# Patient Record
Sex: Female | Born: 1985 | Race: Black or African American | Hispanic: No | Marital: Married | Smoking: Never smoker
Health system: Southern US, Community
[De-identification: ages and names within clinical notes are randomized; demographics above are authoritative.]

## PROBLEM LIST (undated history)

## (undated) DIAGNOSIS — D649 Anemia, unspecified: Secondary | ICD-10-CM

## (undated) DIAGNOSIS — D219 Benign neoplasm of connective and other soft tissue, unspecified: Secondary | ICD-10-CM

## (undated) DIAGNOSIS — I440 Atrioventricular block, first degree: Secondary | ICD-10-CM

## (undated) DIAGNOSIS — B999 Unspecified infectious disease: Secondary | ICD-10-CM

## (undated) DIAGNOSIS — K219 Gastro-esophageal reflux disease without esophagitis: Secondary | ICD-10-CM

## (undated) HISTORY — DX: Atrioventricular block, first degree: I44.0

## (undated) HISTORY — PX: MOUTH SURGERY: SHX715

---
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
## 2003-10-27 ENCOUNTER — Emergency Department (HOSPITAL_COMMUNITY): Admission: EM | Admit: 2003-10-27 | Discharge: 2003-10-27 | Payer: Self-pay | Admitting: Emergency Medicine

## 2004-05-06 ENCOUNTER — Inpatient Hospital Stay (HOSPITAL_COMMUNITY): Admission: AD | Admit: 2004-05-06 | Discharge: 2004-05-06 | Payer: Self-pay | Admitting: *Deleted

## 2004-05-29 ENCOUNTER — Emergency Department (HOSPITAL_COMMUNITY): Admission: EM | Admit: 2004-05-29 | Discharge: 2004-05-30 | Payer: Self-pay | Admitting: Emergency Medicine

## 2004-06-24 ENCOUNTER — Emergency Department (HOSPITAL_COMMUNITY): Admission: EM | Admit: 2004-06-24 | Discharge: 2004-06-24 | Payer: Self-pay | Admitting: Emergency Medicine

## 2004-08-11 ENCOUNTER — Emergency Department (HOSPITAL_COMMUNITY): Admission: EM | Admit: 2004-08-11 | Discharge: 2004-08-11 | Payer: Self-pay

## 2005-01-02 ENCOUNTER — Emergency Department (HOSPITAL_COMMUNITY): Admission: EM | Admit: 2005-01-02 | Discharge: 2005-01-02 | Payer: Self-pay | Admitting: Emergency Medicine

## 2005-07-23 ENCOUNTER — Emergency Department (HOSPITAL_COMMUNITY): Admission: EM | Admit: 2005-07-23 | Discharge: 2005-07-24 | Payer: Self-pay | Admitting: Emergency Medicine

## 2006-05-01 ENCOUNTER — Emergency Department (HOSPITAL_COMMUNITY): Admission: EM | Admit: 2006-05-01 | Discharge: 2006-05-02 | Payer: Self-pay | Admitting: Emergency Medicine

## 2007-02-17 ENCOUNTER — Encounter: Admission: RE | Admit: 2007-02-17 | Discharge: 2007-02-17 | Payer: Self-pay | Admitting: Obstetrics and Gynecology

## 2007-10-17 ENCOUNTER — Ambulatory Visit: Payer: Self-pay | Admitting: Internal Medicine

## 2007-10-17 ENCOUNTER — Ambulatory Visit: Payer: Self-pay | Admitting: *Deleted

## 2007-10-17 ENCOUNTER — Encounter (INDEPENDENT_AMBULATORY_CARE_PROVIDER_SITE_OTHER): Payer: Self-pay | Admitting: Nurse Practitioner

## 2007-10-17 LAB — CONVERTED CEMR LAB
ALT: 12 units/L (ref 0–35)
AST: 17 units/L (ref 0–37)
Albumin: 4.4 g/dL (ref 3.5–5.2)
Alkaline Phosphatase: 80 units/L (ref 39–117)
BUN: 5 mg/dL — ABNORMAL LOW (ref 6–23)
Basophils Absolute: 0 10*3/uL (ref 0.0–0.1)
Basophils Relative: 0 % (ref 0–1)
CO2: 25 meq/L (ref 19–32)
Calcium: 9.5 mg/dL (ref 8.4–10.5)
Chloride: 104 meq/L (ref 96–112)
Creatinine, Ser: 0.94 mg/dL (ref 0.40–1.20)
Eosinophils Absolute: 0 10*3/uL (ref 0.0–0.7)
Eosinophils Relative: 0 % (ref 0–5)
Glucose, Bld: 88 mg/dL (ref 70–99)
HCT: 40.9 % (ref 36.0–46.0)
Hemoglobin: 13.1 g/dL (ref 12.0–15.0)
Lymphocytes Relative: 23 % (ref 12–46)
Lymphs Abs: 2.1 10*3/uL (ref 0.7–3.3)
MCHC: 32 g/dL (ref 30.0–36.0)
MCV: 82.5 fL (ref 78.0–100.0)
Monocytes Absolute: 0.4 10*3/uL (ref 0.2–0.7)
Monocytes Relative: 5 % (ref 3–11)
Neutro Abs: 6.5 10*3/uL (ref 1.7–7.7)
Neutrophils Relative %: 72 % (ref 43–77)
Platelets: 322 10*3/uL (ref 150–400)
Potassium: 4.2 meq/L (ref 3.5–5.3)
RBC: 4.96 M/uL (ref 3.87–5.11)
RDW: 15.6 % — ABNORMAL HIGH (ref 11.5–14.0)
Sodium: 138 meq/L (ref 135–145)
TSH: 1.36 microintl units/mL (ref 0.350–5.50)
Total Bilirubin: 0.5 mg/dL (ref 0.3–1.2)
Total Protein: 7.9 g/dL (ref 6.0–8.3)
WBC: 9.1 10*3/uL (ref 4.0–10.5)

## 2007-11-01 ENCOUNTER — Emergency Department (HOSPITAL_COMMUNITY): Admission: EM | Admit: 2007-11-01 | Discharge: 2007-11-01 | Payer: Self-pay | Admitting: Emergency Medicine

## 2008-10-12 ENCOUNTER — Emergency Department (HOSPITAL_COMMUNITY): Admission: EM | Admit: 2008-10-12 | Discharge: 2008-10-12 | Payer: Self-pay | Admitting: Emergency Medicine

## 2008-12-02 ENCOUNTER — Inpatient Hospital Stay (HOSPITAL_COMMUNITY): Admission: AD | Admit: 2008-12-02 | Discharge: 2008-12-02 | Payer: Self-pay | Admitting: Obstetrics and Gynecology

## 2009-05-19 ENCOUNTER — Inpatient Hospital Stay (HOSPITAL_COMMUNITY): Admission: AD | Admit: 2009-05-19 | Discharge: 2009-05-19 | Payer: Self-pay | Admitting: Obstetrics and Gynecology

## 2009-05-19 ENCOUNTER — Ambulatory Visit: Payer: Self-pay | Admitting: Advanced Practice Midwife

## 2009-06-16 ENCOUNTER — Ambulatory Visit: Payer: Self-pay | Admitting: Obstetrics and Gynecology

## 2009-06-21 ENCOUNTER — Ambulatory Visit (HOSPITAL_COMMUNITY): Admission: RE | Admit: 2009-06-21 | Discharge: 2009-06-21 | Payer: Self-pay | Admitting: Family Medicine

## 2009-09-30 ENCOUNTER — Emergency Department (HOSPITAL_COMMUNITY): Admission: EM | Admit: 2009-09-30 | Discharge: 2009-09-30 | Payer: Self-pay | Admitting: Emergency Medicine

## 2011-04-03 LAB — GC/CHLAMYDIA PROBE AMP, GENITAL
Chlamydia, DNA Probe: NEGATIVE
GC Probe Amp, Genital: NEGATIVE

## 2011-04-03 LAB — CBC
HCT: 31.8 % — ABNORMAL LOW (ref 36.0–46.0)
Hemoglobin: 10.2 g/dL — ABNORMAL LOW (ref 12.0–15.0)
MCHC: 32.1 g/dL (ref 30.0–36.0)
MCV: 70.9 fL — ABNORMAL LOW (ref 78.0–100.0)
Platelets: 341 10*3/uL (ref 150–400)
RBC: 4.48 MIL/uL (ref 3.87–5.11)
RDW: 18.7 % — ABNORMAL HIGH (ref 11.5–15.5)
WBC: 9.3 10*3/uL (ref 4.0–10.5)

## 2011-05-08 NOTE — Group Therapy Note (Signed)
Katie Medina, METZGER NO.:  [id number]   MEDICAL RECORD NO.:  [id number]          PATIENT TYPE:  WOC   LOCATION:  [facility]                   FACILITY:  [facility]   PHYSICIAN:  Argentina Donovan, MD        DATE OF BIRTH:  05-05-1986   DATE OF SERVICE:  06/16/2009                                  CLINIC NOTE   The patient is a 25 year old African American nulligravida who went into  MAU on May 27 with heavy bleeding.  She had previously been treated with  Depo-Provera without success when seen by physicians for women.  They  did an ultrasound which apparently showed uterine fibroids.  She was  placed on Lo/Ovral at that time and it seems to have controlled the  bleeding very nicely.  When she was in the MAU she got an H and H with a  hemoglobin of 10.2 and a hematocrit of 31.8.  She bought some over-the-  counter iron which she started to take.  She had a Pap smear just 3  months ago and on physical examination the uterus is unable to be  palpated because of the habitus of the patient.  She weighs 202 and she  is 5 feet 3 inches tall.  Blood pressure however is normal at 117/76,  pulse of 87.  Talked to the patient, we are going to get an ultrasound  to evaluate the size of the fibroids and then have her come back in 3  months to make sure they are not growing and also to see how she is  doing as far as handling the bleeding.  I also want to get a CBC at that  time to make sure that her hemoglobin is back to normal.  I have given  her a prescription for a year for the Lo/Ovral and also for some Slow-Fe  and will get her scheduled for ultrasound.   IMPRESSION:  1. Severe menometrorrhagia, seems to be controlled by oral      contraceptives at this point.  2. Secondary anemia.           ______________________________  Argentina Donovan, MD     PR/MEDQ  D:  06/16/2009  T:  06/16/2009  Job:  [id number]

## 2011-09-25 LAB — POCT URINALYSIS DIP (DEVICE)
Glucose, UA: NEGATIVE
Hgb urine dipstick: NEGATIVE
Nitrite: NEGATIVE
Operator id: 29721
Protein, ur: 30 — AB
Specific Gravity, Urine: >=1.03
Urobilinogen, UA: 0.2
pH: 5.5

## 2011-09-25 LAB — GC/CHLAMYDIA PROBE AMP, GENITAL
Chlamydia, DNA Probe: NEGATIVE
GC Probe Amp, Genital: NEGATIVE

## 2011-09-25 LAB — POCT PREGNANCY, URINE: Preg Test, Ur: NEGATIVE

## 2011-10-02 LAB — CBC
HCT: 34.9 — ABNORMAL LOW
Hemoglobin: 11.7 — ABNORMAL LOW
MCHC: 33.7
MCV: 80.6
Platelets: 292
RBC: 4.33
RDW: 15.3 — ABNORMAL HIGH
WBC: 12.7 — ABNORMAL HIGH

## 2011-10-02 LAB — BASIC METABOLIC PANEL
BUN: 6
CO2: 27
Calcium: 9.5
Chloride: 105
Creatinine, Ser: 0.95
GFR calc Af Amer: >60
GFR calc non Af Amer: >60
Glucose, Bld: 100 — ABNORMAL HIGH
Potassium: 3.9
Sodium: 138

## 2011-10-02 LAB — URINALYSIS, ROUTINE W REFLEX MICROSCOPIC
Bilirubin Urine: NEGATIVE
Glucose, UA: NEGATIVE
Hgb urine dipstick: NEGATIVE
Ketones, ur: NEGATIVE
Nitrite: NEGATIVE
Protein, ur: NEGATIVE
Specific Gravity, Urine: 1.023
Urobilinogen, UA: 0.2
pH: 5.5

## 2011-10-02 LAB — DIFFERENTIAL
Basophils Absolute: 0
Basophils Relative: 0
Eosinophils Absolute: 0
Eosinophils Relative: 0
Lymphocytes Relative: 9 — ABNORMAL LOW
Lymphs Abs: 1.1
Monocytes Absolute: 0.6
Monocytes Relative: 5
Neutro Abs: 11 — ABNORMAL HIGH
Neutrophils Relative %: 86 — ABNORMAL HIGH

## 2011-10-02 LAB — PREGNANCY, URINE: Preg Test, Ur: NEGATIVE

## 2012-07-16 ENCOUNTER — Encounter (HOSPITAL_COMMUNITY): Payer: Self-pay | Admitting: Pharmacy Technician

## 2012-07-16 ENCOUNTER — Other Ambulatory Visit: Payer: Self-pay | Admitting: Obstetrics and Gynecology

## 2012-07-16 ENCOUNTER — Encounter (HOSPITAL_COMMUNITY)
Admission: RE | Admit: 2012-07-16 | Discharge: 2012-07-16 | Disposition: A | Discharge status: Home / Self Care | Payer: Managed Care, Other (non HMO) | Source: Ambulatory Visit | Attending: Obstetrics and Gynecology | Admitting: Obstetrics and Gynecology

## 2012-07-16 ENCOUNTER — Encounter (HOSPITAL_COMMUNITY): Payer: Self-pay

## 2012-07-16 HISTORY — DX: Gastro-esophageal reflux disease without esophagitis: K21.9

## 2012-07-16 HISTORY — DX: Anemia, unspecified: D64.9

## 2012-07-16 LAB — CBC
HCT: 35.7 % — ABNORMAL LOW (ref 36.0–46.0)
Hemoglobin: 11.2 g/dL — ABNORMAL LOW (ref 12.0–15.0)
MCH: 23.5 pg — ABNORMAL LOW (ref 26.0–34.0)
MCHC: 31.4 g/dL (ref 30.0–36.0)
MCV: 75 fL — ABNORMAL LOW (ref 78.0–100.0)
Platelets: 273 10*3/uL (ref 150–400)
RBC: 4.76 MIL/uL (ref 3.87–5.11)
RDW: 16.1 % — ABNORMAL HIGH (ref 11.5–15.5)
WBC: 6.5 10*3/uL (ref 4.0–10.5)

## 2012-07-16 LAB — SURGICAL PCR SCREEN
MRSA, PCR: NEGATIVE
Staphylococcus aureus: NEGATIVE

## 2012-07-16 NOTE — Patient Instructions (Addendum)
   Your procedure is scheduled on: Thursday July 25th   Enter through the [employer] of [facility] at: 7:45am Pick up the phone at the desk and dial [phone] and inform us of your arrival.  Please call this number if you have any problems the morning of surgery: [phone]  Remember: Do not eat food after midnight: Wednesday Do not drink clear liquids after: midnight Wednesday Take these medicines the morning of surgery with a SIP OF WATER:none  Do not wear jewelry, make-up, or FINGER nail polish No metal in your hair or on your body. Do not wear lotions, powders, perfumes or deodorant. Do not shave 48 hours prior to surgery. Do not bring valuables to the hospital. Contacts, dentures or bridgework may not be worn into surgery.  Leave suitcase in the car. After Surgery it may be brought to your room. For patients being admitted to the hospital, checkout time is 11:00am the day of discharge.      Remember to use your hibiclens as instructed.Please shower with 1/2 bottle the evening before your surgery and the other 1/2 bottle the morning of surgery. Neck down avoiding private area.

## 2012-07-16 NOTE — H&P (Signed)
Katie Medina is an 26 y.o.nulliparous female with massive fibroids up to 28 weeks size . Fibroids have been growing for over a year, with symptoms of menorrhagia, pelvic pressure and discomfort.  She has received 6 doses of Lupron 3.75mg .  Fibroids are still palpable above the umbilicus.  Methods of treatment, myomectomy and hysterectom have been discussed.  Informed consent was given for myomectomy.   Risks, possible complications of myomectomy have been discussed.    Pertinent Gynecological History: Menses:No regular menses since April 2013 due to lupron Bleeding: 07/12/12  Contraception: none DES exposure: denies Blood transfusions: none Sexually transmitted diseases: no past history Previous GYN Procedures: no  Last mammogram: none  Menstrual History: Menarche age 28        :Patient's last menstrual period was 07/12/2012.    Past Medical History  Diagnosis Date  . Anemia     otc iron supplement  . GERD (gastroesophageal reflux disease)     otc reflux reducer prn-has not used in last month-assoc with foods    Past Surgical History  Procedure Date  . Mouth surgery    Family History  Grandparents  Hypertension Grandmother diabetes   Social History:  reports that she has never smoked. She does not have any smokeless tobacco history on file. She reports that she drinks alcohol. She reports that she does not use illicit drugs.  Allergies: No Known Allergies   (Not in a hospital admission)  ROS  Non contributory  Last menstrual period 07/12/2012.  PHYS EXAM   HEENT wnl Chest Clear to Auscultation and percussion S1 and S2 clear BS present Large mass palpated extending from midline to the right upper quadrant. Extremeties normal Pelvic large pelvic mass extending to abdomen   \   Results for orders placed during the hospital encounter of 07/16/12 (from the past 24 hour(s))  CBC     Status: Abnormal   Collection Time   07/16/12  9:02 AM      Component  Value Range   WBC 6.5  4.0 - 10.5 K/uL   RBC 4.76  3.87 - 5.11 MIL/uL   Hemoglobin 11.2 (*) 12.0 - 15.0 g/dL   HCT 29.5 (*) 62.1 - 30.8 %   MCV 75.0 (*) 78.0 - 100.0 fL   MCH 23.5 (*) 26.0 - 34.0 pg   MCHC 31.4  30.0 - 36.0 g/dL   RDW 65.7 (*) 84.6 - 96.2 %   Platelets 273  150 - 400 K/uL  SURGICAL PCR SCREEN     Status: Normal   Collection Time   07/16/12  9:02 AM      Component Value Range   MRSA, PCR NEGATIVE  NEGATIVE   Staphylococcus aureus NEGATIVE  NEGATIVE    No results found.  Assessment/Plan: 26 yo G0 female with large pelvic mass, probable fibroids P:  Myomectomy  GREENE,ELEANOR E 07/16/2012, 10:42 PM

## 2012-07-17 ENCOUNTER — Encounter (HOSPITAL_COMMUNITY): Payer: Self-pay | Admitting: Obstetrics and Gynecology

## 2012-07-17 ENCOUNTER — Inpatient Hospital Stay (HOSPITAL_COMMUNITY)
Admission: AD | Admit: 2012-07-17 | Discharge: 2012-07-19 | DRG: 750 | Disposition: A | Discharge status: Home / Self Care | Payer: Managed Care, Other (non HMO) | Source: Ambulatory Visit | Attending: Obstetrics and Gynecology | Admitting: Obstetrics and Gynecology

## 2012-07-17 ENCOUNTER — Inpatient Hospital Stay (HOSPITAL_COMMUNITY): Payer: Managed Care, Other (non HMO) | Admitting: Anesthesiology

## 2012-07-17 ENCOUNTER — Encounter (HOSPITAL_COMMUNITY): Payer: Self-pay | Admitting: Anesthesiology

## 2012-07-17 ENCOUNTER — Encounter (HOSPITAL_COMMUNITY)
Admission: AD | Disposition: A | Discharge status: Home / Self Care | Payer: Self-pay | Source: Ambulatory Visit | Attending: Obstetrics and Gynecology

## 2012-07-17 DIAGNOSIS — Z9889 Other specified postprocedural states: Secondary | ICD-10-CM

## 2012-07-17 DIAGNOSIS — D259 Leiomyoma of uterus, unspecified: Principal | ICD-10-CM | POA: Diagnosis present

## 2012-07-17 DIAGNOSIS — D219 Benign neoplasm of connective and other soft tissue, unspecified: Secondary | ICD-10-CM

## 2012-07-17 HISTORY — PX: LAPAROTOMY: SHX154

## 2012-07-17 LAB — TYPE AND SCREEN
ABO/RH(D): B POS
Antibody Screen: NEGATIVE
Unit division: 0
Unit division: 0
Unit division: 0
Unit division: 0

## 2012-07-17 LAB — SAMPLE TO BLOOD BANK

## 2012-07-17 LAB — ABO/RH: ABO/RH(D): B POS

## 2012-07-17 LAB — PREPARE RBC (CROSSMATCH)

## 2012-07-17 SURGERY — LAPAROTOMY, EXPLORATORY
Anesthesia: General | Site: Abdomen | Wound class: Clean Contaminated

## 2012-07-17 MED ORDER — PROPOFOL 10 MG/ML IV EMUL
INTRAVENOUS | Status: AC
  Filled 2012-07-17: qty 20

## 2012-07-17 MED ORDER — GLYCOPYRROLATE 0.2 MG/ML IJ SOLN
INTRAMUSCULAR | Status: AC
  Filled 2012-07-17: qty 2

## 2012-07-17 MED ORDER — 0.9 % SODIUM CHLORIDE (POUR BTL) OPTIME
TOPICAL | Status: DC | PRN
  Administered 2012-07-17: 1000 mL

## 2012-07-17 MED ORDER — GLYCOPYRROLATE 0.2 MG/ML IJ SOLN
INTRAMUSCULAR | Status: DC | PRN
  Administered 2012-07-17: .8 mg via INTRAVENOUS

## 2012-07-17 MED ORDER — LACTATED RINGERS IV SOLN
INTRAVENOUS | Status: DC
  Administered 2012-07-17 (×3): via INTRAVENOUS

## 2012-07-17 MED ORDER — MIDAZOLAM HCL 2 MG/2ML IJ SOLN
INTRAMUSCULAR | Status: AC
  Filled 2012-07-17: qty 2

## 2012-07-17 MED ORDER — NEOSTIGMINE METHYLSULFATE 1 MG/ML IJ SOLN
INTRAMUSCULAR | Status: DC | PRN
  Administered 2012-07-17: 4 mg via INTRAVENOUS

## 2012-07-17 MED ORDER — MORPHINE SULFATE 4 MG/ML IJ SOLN
1.0000 mg | INTRAMUSCULAR | Status: DC | PRN
  Administered 2012-07-17: 2 mg via INTRAVENOUS
  Filled 2012-07-17: qty 1

## 2012-07-17 MED ORDER — HYDROMORPHONE HCL PF 1 MG/ML IJ SOLN
INTRAMUSCULAR | Status: DC | PRN
  Administered 2012-07-17: 1 mg via INTRAVENOUS

## 2012-07-17 MED ORDER — ONDANSETRON HCL 4 MG/2ML IJ SOLN
INTRAMUSCULAR | Status: DC | PRN
  Administered 2012-07-17: 4 mg via INTRAVENOUS

## 2012-07-17 MED ORDER — HYDROCODONE-ACETAMINOPHEN 5-325 MG PO TABS
1.0000 | ORAL_TABLET | ORAL | Status: DC | PRN
  Administered 2012-07-18 (×2): 2 via ORAL
  Administered 2012-07-18: 1 via ORAL
  Administered 2012-07-19: 2 via ORAL
  Filled 2012-07-17 (×4): qty 2

## 2012-07-17 MED ORDER — IBUPROFEN 600 MG PO TABS
600.0000 mg | ORAL_TABLET | Freq: Four times a day (QID) | ORAL | Status: DC | PRN
  Administered 2012-07-17: 600 mg via ORAL
  Filled 2012-07-17: qty 1

## 2012-07-17 MED ORDER — MIDAZOLAM HCL 5 MG/5ML IJ SOLN
INTRAMUSCULAR | Status: DC | PRN
  Administered 2012-07-17: 2 mg via INTRAVENOUS

## 2012-07-17 MED ORDER — FENTANYL CITRATE 0.05 MG/ML IJ SOLN
INTRAMUSCULAR | Status: DC | PRN
  Administered 2012-07-17 (×2): 150 ug via INTRAVENOUS
  Administered 2012-07-17 (×2): 100 ug via INTRAVENOUS

## 2012-07-17 MED ORDER — HYDROMORPHONE HCL PF 1 MG/ML IJ SOLN
INTRAMUSCULAR | Status: AC
  Administered 2012-07-17: 0.5 mg via INTRAVENOUS
  Filled 2012-07-17: qty 1

## 2012-07-17 MED ORDER — FENTANYL CITRATE 0.05 MG/ML IJ SOLN
INTRAMUSCULAR | Status: AC
  Filled 2012-07-17: qty 5

## 2012-07-17 MED ORDER — ROCURONIUM BROMIDE 100 MG/10ML IV SOLN
INTRAVENOUS | Status: DC | PRN
  Administered 2012-07-17: 10 mg via INTRAVENOUS
  Administered 2012-07-17: 50 mg via INTRAVENOUS
  Administered 2012-07-17: 20 mg via INTRAVENOUS

## 2012-07-17 MED ORDER — HYDROMORPHONE HCL PF 1 MG/ML IJ SOLN
0.2500 mg | INTRAMUSCULAR | Status: DC | PRN
  Administered 2012-07-17 (×2): 0.5 mg via INTRAVENOUS

## 2012-07-17 MED ORDER — DEXAMETHASONE SODIUM PHOSPHATE 10 MG/ML IJ SOLN
INTRAMUSCULAR | Status: DC | PRN
  Administered 2012-07-17: 10 mg via INTRAVENOUS

## 2012-07-17 MED ORDER — DEXTROSE IN LACTATED RINGERS 5 % IV SOLN
INTRAVENOUS | Status: DC
  Administered 2012-07-17 – 2012-07-18 (×2): via INTRAVENOUS

## 2012-07-17 MED ORDER — CEFAZOLIN SODIUM-DEXTROSE 2-3 GM-% IV SOLR
2.0000 g | INTRAVENOUS | Status: AC
  Administered 2012-07-17: 2 g via INTRAVENOUS

## 2012-07-17 MED ORDER — NEOSTIGMINE METHYLSULFATE 1 MG/ML IJ SOLN
INTRAMUSCULAR | Status: AC
  Filled 2012-07-17: qty 10

## 2012-07-17 MED ORDER — LIDOCAINE HCL (CARDIAC) 20 MG/ML IV SOLN
INTRAVENOUS | Status: DC | PRN
  Administered 2012-07-17: 50 mg via INTRAVENOUS

## 2012-07-17 MED ORDER — TEMAZEPAM 15 MG PO CAPS: 15.0000 mg | ORAL_CAPSULE | Freq: Every evening | ORAL | Status: DC | PRN

## 2012-07-17 MED ORDER — BUPIVACAINE ON-Q PAIN PUMP (FOR ORDER SET NO CHG)
INJECTION | Status: DC
  Filled 2012-07-17: qty 1

## 2012-07-17 MED ORDER — KETOROLAC TROMETHAMINE 30 MG/ML IJ SOLN
INTRAMUSCULAR | Status: AC
  Administered 2012-07-17: 30 mg via INTRAVENOUS
  Filled 2012-07-17: qty 1

## 2012-07-17 MED ORDER — CEFAZOLIN SODIUM-DEXTROSE 2-3 GM-% IV SOLR
INTRAVENOUS | Status: AC
  Filled 2012-07-17: qty 50

## 2012-07-17 MED ORDER — ROCURONIUM BROMIDE 50 MG/5ML IV SOLN
INTRAVENOUS | Status: AC
  Filled 2012-07-17: qty 1

## 2012-07-17 MED ORDER — OXYCODONE-ACETAMINOPHEN 5-325 MG PO TABS
1.0000 | ORAL_TABLET | ORAL | Status: DC | PRN
  Administered 2012-07-17: 2 via ORAL
  Filled 2012-07-17: qty 2

## 2012-07-17 MED ORDER — VASOPRESSIN 20 UNIT/ML IJ SOLN
INTRAMUSCULAR | Status: AC
  Filled 2012-07-17: qty 1

## 2012-07-17 MED ORDER — KETOROLAC TROMETHAMINE 30 MG/ML IJ SOLN
15.0000 mg | Freq: Once | INTRAMUSCULAR | Status: AC | PRN
  Administered 2012-07-17: 30 mg via INTRAVENOUS

## 2012-07-17 MED ORDER — PROPOFOL 10 MG/ML IV EMUL
INTRAVENOUS | Status: DC | PRN
  Administered 2012-07-17: 150 mg via INTRAVENOUS
  Administered 2012-07-17: 50 mg via INTRAVENOUS

## 2012-07-17 MED ORDER — HYDROMORPHONE HCL PF 1 MG/ML IJ SOLN
INTRAMUSCULAR | Status: AC
  Filled 2012-07-17: qty 1

## 2012-07-17 MED ORDER — LIDOCAINE HCL (CARDIAC) 20 MG/ML IV SOLN
INTRAVENOUS | Status: AC
  Filled 2012-07-17: qty 5

## 2012-07-17 MED ORDER — LACTATED RINGERS IV SOLN
INTRAVENOUS | Status: DC | PRN
  Administered 2012-07-17: 10:00:00 via INTRAVENOUS

## 2012-07-17 MED ORDER — DEXAMETHASONE SODIUM PHOSPHATE 10 MG/ML IJ SOLN
INTRAMUSCULAR | Status: AC
  Filled 2012-07-17: qty 1

## 2012-07-17 MED ORDER — MENTHOL 3 MG MT LOZG
1.0000 | LOZENGE | OROMUCOSAL | Status: DC | PRN
  Administered 2012-07-17 – 2012-07-18 (×2): 3 mg via ORAL
  Filled 2012-07-17: qty 9

## 2012-07-17 MED ORDER — ONDANSETRON HCL 4 MG/2ML IJ SOLN
INTRAMUSCULAR | Status: AC
  Filled 2012-07-17: qty 2

## 2012-07-17 SURGICAL SUPPLY — 45 items
BARRIER ADHS 3X4 INTERCEED (GAUZE/BANDAGES/DRESSINGS) IMPLANT
CANISTER SUCTION 2500CC (MISCELLANEOUS) ×3 IMPLANT
CLOTH BEACON ORANGE TIMEOUT ST (SAFETY) ×3 IMPLANT
CONT PATH 16OZ SNAP LID 3702 (MISCELLANEOUS) ×3 IMPLANT
COVER LIGHT HANDLE  1/PK (MISCELLANEOUS) ×1
COVER LIGHT HANDLE 1/PK (MISCELLANEOUS) ×2 IMPLANT
DRAPE LAPAROTOMY T 102X78X121 (DRAPES) ×3 IMPLANT
DRESSING TELFA 8X3 (GAUZE/BANDAGES/DRESSINGS) ×3 IMPLANT
GLOVE BIO SURGEON STRL SZ 6.5 (GLOVE) ×3 IMPLANT
GLOVE BIO SURGEON STRL SZ7 (GLOVE) ×3 IMPLANT
GLOVE BIO SURGEON STRL SZ8 (GLOVE) ×3 IMPLANT
GLOVE BIOGEL PI IND STRL 7.0 (GLOVE) ×8 IMPLANT
GLOVE BIOGEL PI IND STRL 8 (GLOVE) ×2 IMPLANT
GLOVE BIOGEL PI INDICATOR 7.0 (GLOVE) ×4
GLOVE BIOGEL PI INDICATOR 8 (GLOVE) ×1
GLOVE ECLIPSE 8.0 STRL XLNG CF (GLOVE) ×3 IMPLANT
GLOVE NEODERM STER SZ 7 (GLOVE) ×3 IMPLANT
GOWN PREVENTION PLUS LG XLONG (DISPOSABLE) ×6 IMPLANT
GOWN PREVENTION PLUS XLARGE (GOWN DISPOSABLE) ×9 IMPLANT
GOWN SURGICAL LARGE (GOWNS) ×3 IMPLANT
HEMOSTAT SURGICEL 2X14 (HEMOSTASIS) IMPLANT
HEMOSTAT SURGICEL 4X8 (HEMOSTASIS) IMPLANT
NEEDLE HYPO 25X1 1.5 SAFETY (NEEDLE) ×3 IMPLANT
NS IRRIG 1000ML POUR BTL (IV SOLUTION) ×3 IMPLANT
PACK ABDOMINAL GYN (CUSTOM PROCEDURE TRAY) ×3 IMPLANT
PAD ABD 7.5X8 STRL (GAUZE/BANDAGES/DRESSINGS) ×6 IMPLANT
PAD OB MATERNITY 4.3X12.25 (PERSONAL CARE ITEMS) ×3 IMPLANT
SPONGE GAUZE 4X4 12PLY (GAUZE/BANDAGES/DRESSINGS) ×12 IMPLANT
SPONGE LAP 18X18 X RAY DECT (DISPOSABLE) ×3 IMPLANT
STAPLER VISISTAT 35W (STAPLE) ×6 IMPLANT
SUT CHROMIC 2 0 CT 1 (SUTURE) ×3 IMPLANT
SUT PDS AB 0 CTX 60 (SUTURE) ×3 IMPLANT
SUT PLAIN 2 0 XLH (SUTURE) ×6 IMPLANT
SUT VIC AB 0 CT1 18XCR BRD8 (SUTURE) ×6 IMPLANT
SUT VIC AB 0 CT1 36 (SUTURE) ×6 IMPLANT
SUT VIC AB 0 CT1 8-18 (SUTURE) ×3
SUT VIC AB 2-0 SH 27 (SUTURE) ×1
SUT VIC AB 2-0 SH 27XBRD (SUTURE) ×2 IMPLANT
SUT VIC AB 4-0 KS 27 (SUTURE) IMPLANT
SYR CONTROL 10ML LL (SYRINGE) ×3 IMPLANT
TAPE CLOTH SURG 4X10 WHT LF (GAUZE/BANDAGES/DRESSINGS) ×3 IMPLANT
TOWEL OR 17X24 6PK STRL BLUE (TOWEL DISPOSABLE) ×9 IMPLANT
TRAY FOLEY CATH 14FR (SET/KITS/TRAYS/PACK) ×3 IMPLANT
VASOPRESSIN  20UNITS ×3 IMPLANT
WATER STERILE IRR 1000ML POUR (IV SOLUTION) IMPLANT

## 2012-07-17 NOTE — Anesthesia Postprocedure Evaluation (Signed)
  Anesthesia Post-op Note  Patient: Katie Medina  Procedure(s) Performed: Procedure(s) (LRB): EXPLORATORY LAPAROTOMY (N/A) LYSIS OF ADHESION (N/A)  Patient Location: [facility]  Anesthesia Type: General  Level of Consciousness: awake  Airway and Oxygen Therapy: Patient Spontanous Breathing  Post-op Pain: mild  Post-op Assessment: Patient's Cardiovascular Status Stable and Respiratory Function Stable  Post-op Vital Signs: stable  Complications: No apparent anesthesia complications

## 2012-07-17 NOTE — Addendum Note (Signed)
Addendum  created 07/17/12 1951 by Renford Dills, CRNA   Modules edited:Notes Section

## 2012-07-17 NOTE — Transfer of Care (Signed)
Immediate Anesthesia Transfer of Care Note  Patient: Katie Medina  Procedure(s) Performed: Procedure(s) (LRB): EXPLORATORY LAPAROTOMY (N/A) LYSIS OF ADHESION (N/A)  Patient Location: PACU  Anesthesia Type: General  Level of Consciousness: sedated  Airway & Oxygen Therapy: Patient Spontanous Breathing and Patient connected to nasal cannula oxygen  Post-op Assessment: Report given to PACU RN and Post -op Vital signs reviewed and stable  Post vital signs: stable  Complications: No apparent anesthesia complications

## 2012-07-17 NOTE — Anesthesia Postprocedure Evaluation (Signed)
Anesthesia Post Note  Patient: Katie Medina  Procedure(s) Performed: Procedure(s) (LRB): EXPLORATORY LAPAROTOMY (N/A) LYSIS OF ADHESION (N/A)  Anesthesia type: GA  Patient location: PACU  Post pain: Pain level controlled  Post assessment: Post-op Vital signs reviewed  Last Vitals:  Filed Vitals:   07/17/12 1345  BP: 113/64  Pulse: 71  Temp:   Resp: 17    Post vital signs: Reviewed  Level of consciousness: sedated  Complications: No apparent anesthesia complications

## 2012-07-17 NOTE — Anesthesia Preprocedure Evaluation (Addendum)
Anesthesia Evaluation  Patient identified by MRN, date of birth, ID band Patient awake    Reviewed: Allergy & Precautions, H&P , NPO status , Patient's Chart, lab work & pertinent test results, reviewed documented beta blocker date and time   History of Anesthesia Complications Negative for: history of anesthetic complications  Airway Mallampati: III TM Distance: >3 FB Neck ROM: full    Dental  (+) Teeth Intact   Pulmonary neg pulmonary ROS,  breath sounds clear to auscultation  Pulmonary exam normal       Cardiovascular negative cardio ROS  Rhythm:regular Rate:Normal     Neuro/Psych negative neurological ROS  negative psych ROS   GI/Hepatic Neg liver ROS, GERD- (occas PRN meds)  ,  Endo/Other  Morbid obesity  Renal/GU negative Renal ROS  Female GU complaint     Musculoskeletal   Abdominal   Peds  Hematology negative hematology ROS (+)   Anesthesia Other Findings   Reproductive/Obstetrics negative OB ROS                          Anesthesia Physical Anesthesia Plan  ASA: III  Anesthesia Plan: General ETT   Post-op Pain Management:    Induction:   Airway Management Planned:   Additional Equipment:   Intra-op Plan:   Post-operative Plan:   Informed Consent: I have reviewed the patients History and Physical, chart, labs and discussed the procedure including the risks, benefits and alternatives for the proposed anesthesia with the patient or authorized representative who has indicated his/her understanding and acceptance.   Dental Advisory Given  Plan Discussed with: CRNA and Surgeon  Anesthesia Plan Comments:         Anesthesia Quick Evaluation

## 2012-07-17 NOTE — Op Note (Signed)
07/17/2012  11:47 AM  PATIENT:  Katie Medina  26 y.o. female with enlarging fibroids.  The patient is s/p 6 Lupron injections with minimal reduction in the size of the fibroids.  PRE-OPERATIVE DIAGNOSIS:  massive fibroids  POST-OPERATIVE DIAGNOSIS:  massive fibroids  PROCEDURE:  Procedure(s): EXPLORATORY LAPAROTOMY LYSIS OF ADHESIONS  Indication and consent:  The patient is a 26 yo nulliparous female with massive, enlarging fibroids extending above the xyphoid.  Lupron was used to shrink the fibroids with minimal reduction to 28 wks size over 6 mos.  The plan is for myomectomy and the patient understands hysterectomy may be necessary if bleeding can not be controlled.  However the patient desires to retain fertility if possible.  Risks,  Possible complications have been discussed and informed consent was given for myomectomy, possible hysterectomy.  Procedure:  The patient was placed on the table in supine position after general anesthesia was induced.  A midline incision was made extending above the umbilicus.  A massive fibroid presented when the peritoneum was opened.  We were able to manipulate the mass and lift it out of the abdomen and check for anatomy.  The uterus was deviated to the left with the right round ligament draped over it.  The small relative normal sized uterus was on the left of the large ligamentous fibroid mass over 20 cm and extending down laterally on the right.  We could not isolate vascular pedicles to control bleeding and was reluctant not wanting to get into massive hemorrhage which would ensure if an attempt to remove the mass was undertaken.  The appendix was stuck to the back of the mass.  The adhesions were clamped with a Kelly clamp and incised.  The pedicle was tied with vicryl 0 suture in an interrupted fashion.  Intra operative consult to Dr. De Blanch was made for assistance.  All Gyn Oncologists were in [place]. Therefore we consulted Dr.  Antionette Char who scrubbed in.  Overall assessment was that this case would be too complicated to attempt today without adequate blood for transfusion and assistance from a Gyn Oncologist.  Therefore the procedure was stopped with plans to refer to Dr. Loree Fee.    The peritoneum was closed with 2 0 chromic in a running fashion.  The fascia was closed in an interrupted figure of eight fashion using 0 vicryl.  The subcutaneous layer was closed with 2 0 plain suture.  The skin was approximated with staples.     Sponge and instrument counts were correct.  SURGEON:  Arlyce Harman, MD  PHYSICIAN ASSISTANT: Filbert Berthold MD  Intraoperative consultants:  Dr Antionette Char Dr. Coral Ceo   ANESTHESIA:   general  EBL:  Total I/O In: 2200 [I.V.:2200] Out: 275 [Urine:200; Blood:75]  BLOOD ADMINISTERED:none  DRAINS: Urinary Catheter (Foley)   LOCAL MEDICATIONS USED:  NONE  SPECIMEN:  No Specimen  DISPOSITION OF SPECIMEN:  N/A  COUNTS:  YES  TOURNIQUET:  * No tourniquets in log *  DICTATION: .Dragon Dictation  PLAN OF CARE: Admit to inpatient   PATIENT DISPOSITION:  PACU - hemodynamically stable.   Delay start of Pharmacological VTE agent (>24hrs) due to surgical blood loss or risk of bleeding:  {YES/NO/NOT APPLICABLE:20182

## 2012-07-18 LAB — CBC
HCT: 33.3 % — ABNORMAL LOW (ref 36.0–46.0)
Hemoglobin: 10.5 g/dL — ABNORMAL LOW (ref 12.0–15.0)
MCH: 23.9 pg — ABNORMAL LOW (ref 26.0–34.0)
MCHC: 31.5 g/dL (ref 30.0–36.0)
MCV: 75.9 fL — ABNORMAL LOW (ref 78.0–100.0)
Platelets: 281 10*3/uL (ref 150–400)
RBC: 4.39 MIL/uL (ref 3.87–5.11)
RDW: 16.4 % — ABNORMAL HIGH (ref 11.5–15.5)
WBC: 12.9 10*3/uL — ABNORMAL HIGH (ref 4.0–10.5)

## 2012-07-18 MED ORDER — FERROUS SULFATE 300 (60 FE) MG/5ML PO SYRP
300.0000 mg | ORAL_SOLUTION | Freq: Every day | ORAL | Status: DC
  Administered 2012-07-18 – 2012-07-19 (×2): 300 mg via ORAL
  Filled 2012-07-18 (×3): qty 5

## 2012-07-18 MED ORDER — BISACODYL 5 MG PO TBEC
5.0000 mg | DELAYED_RELEASE_TABLET | Freq: Every day | ORAL | Status: DC | PRN
  Administered 2012-07-18: 5 mg via ORAL
  Filled 2012-07-18: qty 1

## 2012-07-18 MED ORDER — FERROUS SULFATE 220 (44 FE) MG/5ML PO ELIX
220.0000 mg | ORAL_SOLUTION | Freq: Every day | ORAL | Status: DC
  Filled 2012-07-18: qty 5

## 2012-07-18 NOTE — Progress Notes (Signed)
UR Chart review completed.  

## 2012-07-18 NOTE — Progress Notes (Signed)
1 Day Post-Op Procedure(s) (LRB): EXPLORATORY LAPAROTOMY (N/A) LYSIS OF ADHESION (N/A)  Subjective: Patient reports tolerating PO.    Objective: I have reviewed patient's vital signs, intake and output and labs.  General: alert, cooperative and no distress Resp: clear to auscultation bilaterally and normal percussion bilaterally Cardio: regular rate and rhythm, S1, S2 normal, no murmur, click, rub or gallop GI: soft, non-tender; bowel sounds normal; no masses,  no organomegaly and incision: clean and dry Extremities: extremities normal, atraumatic, no cyanosis or edema Vaginal Bleeding: none and minimal general normal  Assessment: s/p Procedure(s): EXPLORATORY LAPAROTOMY LYSIS OF ADHESION: stable  Plan: Advance diet Encourage ambulation Advance to PO medication Discontinue IV fluids  LOS: 1 day    Katie Medina E 07/18/2012, 7:51 AM

## 2012-07-18 NOTE — Progress Notes (Signed)
Patient ID: Katie Medina, female   DOB: 22-Sep-1986, 26 y.o.   MRN: [id number] Pt seen and doing well positive flatus and tolerating po.  Temp:  [97.4 F (36.3 C)-98.4 F (36.9 C)] 97.9 F (36.6 C) (07/26 1400) Pulse Rate:  [70-99] 95  (07/26 1400) Resp:  [14-18] 18  (07/26 1400) BP: (99-146)/(61-84) 120/78 mmHg (07/26 1400) SpO2:  [96 %-100 %] 98 % (07/26 1400) Weight:  [104.327 kg (230 lb)] 104.327 kg (230 lb) (07/25 2000)  Appears well  Up in bed  Abdomen non acute extrem nontender  A: POD #1 s/p ex lap for enlarged fibroids P: d/c IVF  Probable discharge in am

## 2012-07-19 ENCOUNTER — Encounter (HOSPITAL_COMMUNITY): Payer: Self-pay | Admitting: Obstetrics and Gynecology

## 2012-07-19 DIAGNOSIS — D219 Benign neoplasm of connective and other soft tissue, unspecified: Secondary | ICD-10-CM

## 2012-07-19 MED ORDER — HYDROCODONE-ACETAMINOPHEN 5-325 MG PO TABS: 1.0000 | ORAL_TABLET | ORAL | Status: AC | PRN

## 2012-07-19 MED ORDER — FERROUS SULFATE 300 (60 FE) MG/5ML PO SYRP: 300.0000 mg | ORAL_SOLUTION | Freq: Two times a day (BID) | ORAL | Status: DC

## 2012-07-19 MED ORDER — IBUPROFEN 600 MG PO TABS: 600.0000 mg | ORAL_TABLET | Freq: Four times a day (QID) | ORAL | Status: AC | PRN

## 2012-07-19 NOTE — Discharge Summary (Addendum)
Physician Discharge Summary  Patient ID: Katie Medina MRN: [id number] DOB/AGE: 1986/05/11 26 y.o.  Admit date: 07/17/2012 Discharge date: 07/19/2012  Admission Diagnoses: Enlarged fibroid uterus  Discharge Diagnoses:  Active Problems:  Fibroids   Discharged Condition: good  Hospital Course: The patient was admitted for myomectomy.  Initially with soft fibroid uterus was with a pedunculated fundal fibroid extending superiorly.  Once inside the abdomen the fibroid was noted to be a right broad ligament fibroid the uterus itself had a small 2 cm fibroid but appeared to be remarkably normal otherwise.  The fibroid did extend approximately 30 cm.  The fibroid pushed the right ovary to the left side of the body.  The appendix was attached to the infundibulopelvic ligament on the right.  It appeared normal.  The ureter appeared to be to the right pelvic sidewall.  Initially it was thought that this be removed through division of the round ligament retroperitoneal dissection under the broad ligament.  However, the decision was made to discontinue the procedure secondary to the fact of safety, availability of resources, availability blood products, and the extreme difficulty of the case.  The GYN oncologist was called.  However they were not available.  The decision was made aware of all the above to proceed with the minimal lysis of adhesions that was performed and discontinue the exploratory laparotomy.  The patient will referred to [facility] for further disposition.  Consults: Intraoperative telephone consultation with GYN oncology at [facility].  3 separate gynecologist came into the room and Dr. Antionette Char actually scrubbed in for further evaluation and recommendation.  Significant Diagnostic Studies: None  Treatments: The patient underwent exploratory laparotomy and lysis of adhesions.  Discharge Exam: Blood pressure 119/80, pulse 82, temperature 98.3 F (36.8 C),  temperature source Oral, resp. rate 20, height 5\' 3"  (1.6 m), weight 104.327 kg (230 lb), SpO2 98.00%. Please see today's progress note, exam unremarkable and within normal limits for status post exploratory laparotomy without evidence of acute abdomen.  And the wound was healing well.  Disposition: Final discharge disposition not confirmed the patient will be discharged home and will call the office on Monday for further disposition and appointment scheduling with [facility]  Discharge Orders    Future Orders Please Complete By Expires   Diet - low sodium heart healthy      Increase activity slowly      Discharge instructions      Comments:   Routine care - call with any questions F/u one week   Driving Restrictions      Comments:   None for 2 weeks   Lifting restrictions      Comments:   Nothing you have to stop talking to do   Sexual Activity Restrictions      Comments:   None for 2 weeks   Other Restrictions      Comments:   Per protocol   Discharge wound care:      Comments:   Ok to shower  Please report any discharge, or redness or fever   No dressing needed      Call MD for:  extreme fatigue      Call MD for:  persistant dizziness or light-headedness      Call MD for:  hives      Call MD for:  difficulty breathing, headache or visual disturbances      Call MD for:  redness, tenderness, or signs of infection (pain, swelling, redness,  odor or green/yellow discharge around incision site)      Call MD for:  severe uncontrolled pain      Call MD for:  persistant nausea and vomiting      Call MD for:  temperature >100.4        Medication List  As of 07/19/2012  1:14 PM   STOP taking these medications         OVER THE COUNTER MEDICATION         TAKE these medications         ferrous sulfate 300 (60 FE) MG/5ML syrup   Take 5 mLs (300 mg total) by mouth 2 (two) times daily.      HYDROcodone-acetaminophen 5-325 MG per tablet   Commonly known as: NORCO/VICODIN    Take 1-2 tablets by mouth every 4 (four) hours as needed.      ibuprofen 600 MG tablet   Commonly known as: ADVIL,MOTRIN   Take 1 tablet (600 mg total) by mouth every 6 (six) hours as needed (mild pain).            this is a Dr. Filbert Berthold dictating for Dr. Arlyce Harman  Signed: Delbert Harness. 07/19/2012, 1:14 PM

## 2012-07-19 NOTE — Progress Notes (Signed)
Discharge instructions reviewed with patient.  Patient able to " Teach Back" home care and signs/symptoms to report to MD.  No home equipment needed.  Ambulated to car with staff without incident and discharged to care of family. 

## 2012-07-19 NOTE — Progress Notes (Signed)
2 Days Post-Op Procedure(s) (LRB): EXPLORATORY LAPAROTOMY (N/A) LYSIS OF ADHESION (N/A)  Subjective: Patient reports tolerating PO, + flatus, + BM and no problems voiding.   Doing well since her pain control is adequate with the Vicodin she has tolerated this without any problems.  She wishes to go home. Objective: I have reviewed patient's vital signs, intake and output, medications and labs.  General: alert, cooperative and no distress Resp: No respiratory difficulty Cardio: regular rate and rhythm GI: normal findings: Fibroid uterus is palpable above the umbilicus and the abdomen is nonacute.  An attempt and not distended.  Other than secondary to the mass Extremities: extremities normal, atraumatic, no cyanosis or edema    Assessment: s/p Procedure(s) (LRB): EXPLORATORY LAPAROTOMY (N/A) LYSIS OF ADHESION (N/A): stable  Plan: Discharge home  LOS: 2 days    Katie Medina H. 07/19/2012, 1:10 PM

## 2012-08-08 ENCOUNTER — Ambulatory Visit: Payer: Managed Care, Other (non HMO) | Admitting: Gynecology

## 2012-08-18 ENCOUNTER — Other Ambulatory Visit (HOSPITAL_COMMUNITY)
Admission: RE | Admit: 2012-08-18 | Discharge: 2012-08-18 | Disposition: A | Discharge status: Home / Self Care | Payer: Managed Care, Other (non HMO) | Source: Ambulatory Visit | Attending: Obstetrics and Gynecology | Admitting: Obstetrics and Gynecology

## 2012-08-18 ENCOUNTER — Other Ambulatory Visit: Payer: Self-pay | Admitting: Obstetrics and Gynecology

## 2012-08-18 DIAGNOSIS — Z124 Encounter for screening for malignant neoplasm of cervix: Secondary | ICD-10-CM | POA: Insufficient documentation

## 2012-08-24 HISTORY — PX: MYOMECTOMY: SHX85

## 2012-09-10 ENCOUNTER — Ambulatory Visit (INDEPENDENT_AMBULATORY_CARE_PROVIDER_SITE_OTHER): Payer: Managed Care, Other (non HMO) | Admitting: Surgery

## 2013-11-17 ENCOUNTER — Inpatient Hospital Stay (HOSPITAL_COMMUNITY)
Admission: AD | Admit: 2013-11-17 | Discharge: 2013-11-17 | Disposition: A | Discharge status: Home / Self Care | Payer: Managed Care, Other (non HMO) | Source: Ambulatory Visit | Attending: Family Medicine | Admitting: Family Medicine

## 2013-11-17 ENCOUNTER — Encounter (HOSPITAL_COMMUNITY): Payer: Self-pay | Admitting: *Deleted

## 2013-11-17 DIAGNOSIS — D649 Anemia, unspecified: Secondary | ICD-10-CM | POA: Insufficient documentation

## 2013-11-17 DIAGNOSIS — L293 Anogenital pruritus, unspecified: Secondary | ICD-10-CM | POA: Insufficient documentation

## 2013-11-17 DIAGNOSIS — A499 Bacterial infection, unspecified: Secondary | ICD-10-CM | POA: Insufficient documentation

## 2013-11-17 DIAGNOSIS — R109 Unspecified abdominal pain: Secondary | ICD-10-CM | POA: Insufficient documentation

## 2013-11-17 DIAGNOSIS — B9689 Other specified bacterial agents as the cause of diseases classified elsewhere: Secondary | ICD-10-CM | POA: Insufficient documentation

## 2013-11-17 DIAGNOSIS — N760 Acute vaginitis: Secondary | ICD-10-CM | POA: Insufficient documentation

## 2013-11-17 HISTORY — DX: Benign neoplasm of connective and other soft tissue, unspecified: D21.9

## 2013-11-17 HISTORY — DX: Unspecified infectious disease: B99.9

## 2013-11-17 LAB — URINALYSIS, ROUTINE W REFLEX MICROSCOPIC
Bilirubin Urine: NEGATIVE
Glucose, UA: NEGATIVE mg/dL
Hgb urine dipstick: NEGATIVE
Ketones, ur: NEGATIVE mg/dL
Leukocytes, UA: NEGATIVE
Nitrite: NEGATIVE
Protein, ur: NEGATIVE mg/dL
Specific Gravity, Urine: >1.03 — ABNORMAL HIGH (ref 1.005–1.030)
Urobilinogen, UA: 0.2 mg/dL (ref 0.0–1.0)
pH: 6 (ref 5.0–8.0)

## 2013-11-17 LAB — CBC
HCT: 30.9 % — ABNORMAL LOW (ref 36.0–46.0)
Hemoglobin: 9.3 g/dL — ABNORMAL LOW (ref 12.0–15.0)
MCH: 19.5 pg — ABNORMAL LOW (ref 26.0–34.0)
MCHC: 30.1 g/dL (ref 30.0–36.0)
MCV: 64.6 fL — ABNORMAL LOW (ref 78.0–100.0)
Platelets: 394 10*3/uL (ref 150–400)
RBC: 4.78 MIL/uL (ref 3.87–5.11)
RDW: 18.7 % — ABNORMAL HIGH (ref 11.5–15.5)
WBC: 8.9 10*3/uL (ref 4.0–10.5)

## 2013-11-17 LAB — WET PREP, GENITAL
Trich, Wet Prep: NONE SEEN
Yeast Wet Prep HPF POC: NONE SEEN

## 2013-11-17 LAB — POCT PREGNANCY, URINE: Preg Test, Ur: NEGATIVE

## 2013-11-17 MED ORDER — KETOROLAC TROMETHAMINE 60 MG/2ML IM SOLN
60.0000 mg | Freq: Once | INTRAMUSCULAR | Status: AC
  Administered 2013-11-17: 60 mg via INTRAMUSCULAR
  Filled 2013-11-17: qty 2

## 2013-11-17 MED ORDER — FLUCONAZOLE 150 MG PO TABS: 150.0000 mg | ORAL_TABLET | Freq: Every day | ORAL | Status: DC

## 2013-11-17 MED ORDER — METRONIDAZOLE 500 MG PO TABS: 500.0000 mg | ORAL_TABLET | Freq: Two times a day (BID) | ORAL | Status: DC

## 2013-11-17 NOTE — MAU Note (Signed)
This morning had bad cramps- felt like needed to use restroom, (no diarrhea)got hot, felt nauseated.  Also been having itching for about 3 wks.Using otc cream at night for vag itching- started using it when 'skin started shedding'.

## 2013-11-17 NOTE — MAU Provider Note (Signed)
History     CSN: [id number]  Arrival date and time: 11/17/13 4098   First Provider Initiated Contact with Patient 11/17/13 [id number]      Chief Complaint  Patient presents with  . Abdominal Pain   HPI  [name] is a 27 y.o. female G0P0 who presents with abdominal cramping that has been going on for 3 days. The cramping is on going, however it comes and goes. She felt a cramp this morning and felt like she could not take the pain any more. She currently rates her pain 7/10. She also complains of vaginal irritation and vaginal itching; denies abnormal discharge. Her and her spouse are trying to become pregnant. She was due for her period at the beginning of the month and it never came. She has had irregular periods for 3-4 months.   OB History   Grav Para Term Preterm Abortions TAB SAB Ect Mult Living   0               Past Medical History  Diagnosis Date  . Anemia     otc iron supplement  . GERD (gastroesophageal reflux disease)     otc reflux reducer prn-has not used in last month-assoc with foods  . Fibroid   . Infection     UTI    Past Surgical History  Procedure Laterality Date  . Mouth surgery    . Laparotomy  07/17/2012    Procedure: EXPLORATORY LAPAROTOMY;  Surgeon: Fortino Sic, MD;  Location: [facility];  Service: Gynecology;  Laterality: N/A;    Family History  Problem Relation Age of Onset  . Diabetes Father   . Asthma Brother   . Diabetes Maternal Grandmother   . Hypertension Maternal Grandfather   . Cancer Maternal Grandfather     prostate  . Vision loss Maternal Grandfather   . Diabetes Paternal Grandmother   . Hearing loss Neg Hx     History  Substance Use Topics  . Smoking status: Never Smoker   . Smokeless tobacco: Never Used  . Alcohol Use: Yes     Comment: rare    Allergies:  Allergies  Allergen Reactions  . Percocet [Oxycodone-Acetaminophen] Other (See Comments)    Causes back pain.    Prescriptions prior to admission   Medication Sig Dispense Refill  . famotidine (PEPCID) 20 MG tablet Take 40 mg by mouth daily as needed for heartburn or indigestion.      . miconazole (MICOTIN) 2 % cream Apply 1 application topically at bedtime as needed (For itching.).       Results for orders placed during the hospital encounter of 11/17/13 (from the past 24 hour(s))  URINALYSIS, ROUTINE W REFLEX MICROSCOPIC     Status: Abnormal   Collection Time    11/17/13  8:50 AM      Result Value Range   Color, Urine YELLOW  YELLOW   APPearance CLEAR  CLEAR   Specific Gravity, Urine >1.030 (*) 1.005 - 1.030   pH 6.0  5.0 - 8.0   Glucose, UA NEGATIVE  NEGATIVE mg/dL   Hgb urine dipstick NEGATIVE  NEGATIVE   Bilirubin Urine NEGATIVE  NEGATIVE   Ketones, ur NEGATIVE  NEGATIVE mg/dL   Protein, ur NEGATIVE  NEGATIVE mg/dL   Urobilinogen, UA 0.2  0.0 - 1.0 mg/dL   Nitrite NEGATIVE  NEGATIVE   Leukocytes, UA NEGATIVE  NEGATIVE  POCT PREGNANCY, URINE     Status: None   Collection Time  11/17/13  9:14 AM      Result Value Range   Preg Test, Ur NEGATIVE  NEGATIVE  CBC     Status: Abnormal   Collection Time    11/17/13  9:49 AM      Result Value Range   WBC 8.9  4.0 - 10.5 K/uL   RBC 4.78  3.87 - 5.11 MIL/uL   Hemoglobin 9.3 (*) 12.0 - 15.0 g/dL   HCT 27.2 (*) 53.6 - 64.4 %   MCV 64.6 (*) 78.0 - 100.0 fL   MCH 19.5 (*) 26.0 - 34.0 pg   MCHC 30.1  30.0 - 36.0 g/dL   RDW 03.4 (*) 74.2 - 59.5 %   Platelets 394  150 - 400 K/uL  WET PREP, GENITAL     Status: Abnormal   Collection Time    11/17/13 10:21 AM      Result Value Range   Yeast Wet Prep HPF POC NONE SEEN  NONE SEEN   Trich, Wet Prep NONE SEEN  NONE SEEN   Clue Cells Wet Prep HPF POC FEW (*) NONE SEEN   WBC, Wet Prep HPF POC FEW (*) NONE SEEN     Review of Systems  Constitutional: Negative for fever and chills.  Gastrointestinal: Positive for nausea and abdominal pain. Negative for vomiting, diarrhea and constipation.  Genitourinary: Negative for dysuria,  urgency, frequency and hematuria.       No vaginal discharge. No vaginal bleeding. No dysuria.   Musculoskeletal: Negative for back pain.  Neurological: Negative for dizziness and headaches.   Physical Exam   Blood pressure 119/69, pulse 86, temperature 98.3 F (36.8 C), temperature source Oral, resp. rate 18, height 5\' 4"  (1.626 m), weight 110.678 kg (244 lb), last menstrual period 10/03/2013.  Physical Exam  Constitutional: She is oriented to person, place, and time. She appears well-developed and well-nourished. No distress.  HENT:  Head: Normocephalic.  Eyes: Pupils are equal, round, and reactive to light.  Neck: Neck supple.  Respiratory: Effort normal.  GI: Soft. She exhibits no distension. There is no tenderness. There is no rebound and no guarding.  Genitourinary:  Speculum exam: Vagina - Small amount of creamy, thick, white discharge, no odor Cervix - No contact bleeding Bimanual exam: Cervix closed Uterus non tender, normal size Adnexa non tender, no masses bilaterally GC/Chlam, wet prep done Chaperone present for exam.   Neurological: She is alert and oriented to person, place, and time.  Skin: Skin is warm and dry. She is not diaphoretic.    MAU Course  Procedures None  MDM Toradol 60 mg IM in MAU  Wet prep GC/Chlamydia-pending  Pain at the time of discharge is 0/10  Assessment and Plan   A: Abdominal pain  Bacterial vaginosis Anemia   P: Discharge home RX: Flagyl- no alcohol Start taking Iron supplement as directed on the bottle Return to MAU as needed, if symptoms worsen Take a home pregnancy test if no period by next week.  Ok to take ibuprofen as directed on the bottle.   [name] IRENE NP 11/17/2013, 4:32 PM

## 2013-11-17 NOTE — MAU Note (Signed)
Neg HPT last wk, is trying to get preg.

## 2013-11-17 NOTE — MAU Provider Note (Signed)
Chart reviewed and agree with management and plan.  

## 2013-11-18 LAB — GC/CHLAMYDIA PROBE AMP
CT Probe RNA: NEGATIVE
GC Probe RNA: NEGATIVE

## 2014-11-28 ENCOUNTER — Encounter (HOSPITAL_COMMUNITY): Payer: Self-pay | Admitting: Emergency Medicine

## 2014-11-28 ENCOUNTER — Emergency Department (HOSPITAL_COMMUNITY)
Admission: EM | Admit: 2014-11-28 | Discharge: 2014-11-28 | Disposition: A | Discharge status: Home / Self Care | Payer: Self-pay | Attending: Emergency Medicine | Admitting: Emergency Medicine

## 2014-11-28 DIAGNOSIS — Z8742 Personal history of other diseases of the female genital tract: Secondary | ICD-10-CM | POA: Insufficient documentation

## 2014-11-28 DIAGNOSIS — K219 Gastro-esophageal reflux disease without esophagitis: Secondary | ICD-10-CM | POA: Insufficient documentation

## 2014-11-28 DIAGNOSIS — D649 Anemia, unspecified: Secondary | ICD-10-CM | POA: Insufficient documentation

## 2014-11-28 DIAGNOSIS — Z87440 Personal history of urinary (tract) infections: Secondary | ICD-10-CM | POA: Insufficient documentation

## 2014-11-28 DIAGNOSIS — L03011 Cellulitis of right finger: Secondary | ICD-10-CM | POA: Insufficient documentation

## 2014-11-28 DIAGNOSIS — Z79899 Other long term (current) drug therapy: Secondary | ICD-10-CM | POA: Insufficient documentation

## 2014-11-28 DIAGNOSIS — IMO0001 Reserved for inherently not codable concepts without codable children: Secondary | ICD-10-CM

## 2014-11-28 MED ORDER — LIDOCAINE HCL 2 % IJ SOLN
2.0000 mL | Freq: Once | INTRAMUSCULAR | Status: AC
  Administered 2014-11-28: 40 mg
  Filled 2014-11-28: qty 20

## 2014-11-28 NOTE — ED Notes (Signed)
MD at bedside. 

## 2014-11-28 NOTE — ED Notes (Signed)
Awake. Verbally responsive. A/O x4. Resp even and unlabored. No audible adventitious breath sounds noted. ABC's intact. NAD noted. 

## 2014-11-28 NOTE — ED Notes (Signed)
MD at bedside to suture lac. Pt tolerating well.

## 2014-11-28 NOTE — ED Provider Notes (Signed)
CSN: [id number]     Arrival date & time 11/28/14  0059 History   First MD Initiated Contact with Patient 11/28/14 0148     Chief Complaint  Patient presents with  . Hand Pain     (Consider location/radiation/quality/duration/timing/severity/associated sxs/prior Treatment) HPI  This is a 28 year old female who recently had her artificial nails removed. She is here with a several day history of pain and swelling along the medial side of the nail of the right third finger. There is moderate associated pain, worse with palpation. She tried to incise it herself but was not successful. She has been placing turmeric powder on it and attempt to bring it to a head.  Past Medical History  Diagnosis Date  . Anemia     otc iron supplement  . GERD (gastroesophageal reflux disease)     otc reflux reducer prn-has not used in last month-assoc with foods  . Fibroid   . Infection     UTI   Past Surgical History  Procedure Laterality Date  . Mouth surgery    . Laparotomy  07/17/2012    Procedure: EXPLORATORY LAPAROTOMY;  Surgeon: Avel Sensor, MD;  Location: [facility];  Service: Gynecology;  Laterality: N/A;   Family History  Problem Relation Age of Onset  . Diabetes Father   . Asthma Brother   . Diabetes Maternal Grandmother   . Hypertension Maternal Grandfather   . Cancer Maternal Grandfather     prostate  . Vision loss Maternal Grandfather   . Diabetes Paternal Grandmother   . Hearing loss Neg Hx    History  Substance Use Topics  . Smoking status: Never Smoker   . Smokeless tobacco: Never Used  . Alcohol Use: Yes     Comment: rare   OB History    Gravida Para Term Preterm AB TAB SAB Ectopic Multiple Living   0              Review of Systems  All other systems reviewed and are negative.   Allergies  Percocet  Home Medications   Prior to Admission medications   Medication Sig Start Date End Date Taking? Authorizing Provider  bismuth subsalicylate (PEPTO BISMOL) 262  MG/15ML suspension Take 30 mLs by mouth every 6 (six) hours as needed for indigestion.   Yes Historical Provider, MD  Turmeric POWD Apply 2.5 mLs topically daily.   Yes Historical Provider, MD  famotidine (PEPCID) 20 MG tablet Take 40 mg by mouth daily as needed for heartburn or indigestion.    Historical Provider, MD  fluconazole (DIFLUCAN) 150 MG tablet Take 1 tablet (150 mg total) by mouth daily. Patient not taking: Reported on 11/28/2014 11/17/13   Darrelyn Hillock Rasch, NP  metroNIDAZOLE (FLAGYL) 500 MG tablet Take 1 tablet (500 mg total) by mouth 2 (two) times daily. Patient not taking: Reported on 11/28/2014 11/17/13   Darrelyn Hillock Rasch, NP  miconazole (MICOTIN) 2 % cream Apply 1 application topically at bedtime as needed (For itching.).    Historical Provider, MD   BP 129/70 mmHg  Pulse 88  Temp(Src) 97.8 F (36.6 C) (Oral)  Resp 16  SpO2 100%  LMP 11/27/2014   Physical Exam  General: Well-developed, well-nourished female in no acute distress; appearance consistent with age of record HENT: normocephalic; atraumatic Eyes: Normal appearance Neck: supple Heart: regular rate and rhythm Lungs: Normal respiratory effort and excursion Abdomen: soft; nondistended Extremities: No deformity; full range of motion; pulses normal; swelling and tenderness along the medial aspect  of the right middle fingernail, no felon of the associated finger pad Neurologic: Awake, alert and oriented; motor function intact in all extremities and symmetric; no facial droop Skin: Warm and dry Psychiatric: Normal mood and affect    ED Course  Procedures (including critical care time)  INCISION AND DRAINAGE Performed by: Wynetta Fines Consent: Verbal consent obtained. Risks and benefits: risks, benefits and alternatives were discussed Type: Paronychia   Body area: Right middle finger  Anesthesia: Hemi-digital block  Local anesthetic: lidocaine 2 % without epinephrine  Anesthetic total: 2  ml  Incision was made with a scalpel.  Complexity: simple Blunt dissection to break up loculations  Drainage: purulent  Drainage amount: Moderate   Packing material: None   Patient tolerance: Patient tolerated the procedure well with no immediate complications.     MDM    Wynetta Fines, MD 11/28/14 [phone]

## 2014-11-28 NOTE — ED Notes (Signed)
Applied dry dsg to incisional site. Pt tolerated well.

## 2014-11-28 NOTE — ED Notes (Signed)
Pt arrived to the ED with a complaint of a right middle finger swelling.  Pt believed she had a hang nail which she tried to lance.  Pt states no pus came out when she did this.

## 2014-11-28 NOTE — Discharge Instructions (Signed)

## 2015-02-08 ENCOUNTER — Ambulatory Visit: Payer: Self-pay | Admitting: Licensed Clinical Social Worker

## 2015-03-07 ENCOUNTER — Encounter (HOSPITAL_COMMUNITY): Payer: Self-pay

## 2015-03-07 ENCOUNTER — Emergency Department (HOSPITAL_COMMUNITY)
Admission: EM | Admit: 2015-03-07 | Discharge: 2015-03-07 | Disposition: A | Discharge status: Home / Self Care | Payer: 59 | Attending: Emergency Medicine | Admitting: Emergency Medicine

## 2015-03-07 DIAGNOSIS — Z862 Personal history of diseases of the blood and blood-forming organs and certain disorders involving the immune mechanism: Secondary | ICD-10-CM | POA: Insufficient documentation

## 2015-03-07 DIAGNOSIS — Z87440 Personal history of urinary (tract) infections: Secondary | ICD-10-CM | POA: Insufficient documentation

## 2015-03-07 DIAGNOSIS — I455 Other specified heart block: Secondary | ICD-10-CM | POA: Diagnosis not present

## 2015-03-07 DIAGNOSIS — R55 Syncope and collapse: Secondary | ICD-10-CM

## 2015-03-07 DIAGNOSIS — I440 Atrioventricular block, first degree: Secondary | ICD-10-CM

## 2015-03-07 DIAGNOSIS — Z8719 Personal history of other diseases of the digestive system: Secondary | ICD-10-CM | POA: Insufficient documentation

## 2015-03-07 DIAGNOSIS — Z8742 Personal history of other diseases of the female genital tract: Secondary | ICD-10-CM | POA: Diagnosis not present

## 2015-03-07 DIAGNOSIS — R110 Nausea: Secondary | ICD-10-CM | POA: Insufficient documentation

## 2015-03-07 DIAGNOSIS — Z3202 Encounter for pregnancy test, result negative: Secondary | ICD-10-CM | POA: Insufficient documentation

## 2015-03-07 DIAGNOSIS — R42 Dizziness and giddiness: Secondary | ICD-10-CM | POA: Diagnosis not present

## 2015-03-07 LAB — BASIC METABOLIC PANEL
Anion gap: 11 (ref 5–15)
BUN: 8 mg/dL (ref 6–23)
CO2: 23 mmol/L (ref 19–32)
Calcium: 9.6 mg/dL (ref 8.4–10.5)
Chloride: 106 mmol/L (ref 96–112)
Creatinine, Ser: 0.9 mg/dL (ref 0.50–1.10)
GFR calc Af Amer: >90 mL/min (ref >90)
GFR calc non Af Amer: 85 mL/min — ABNORMAL LOW (ref >90)
Glucose, Bld: 105 mg/dL — ABNORMAL HIGH (ref 70–99)
Potassium: 3.6 mmol/L (ref 3.5–5.1)
Sodium: 140 mmol/L (ref 135–145)

## 2015-03-07 LAB — CBC WITH DIFFERENTIAL/PLATELET
Basophils Absolute: 0 10*3/uL (ref 0.0–0.1)
Basophils Relative: 0 % (ref 0–1)
Eosinophils Absolute: 0.1 10*3/uL (ref 0.0–0.7)
Eosinophils Relative: 1 % (ref 0–5)
HCT: 41.1 % (ref 36.0–46.0)
Hemoglobin: 12.8 g/dL (ref 12.0–15.0)
Lymphocytes Relative: 21 % (ref 12–46)
Lymphs Abs: 2.1 10*3/uL (ref 0.7–4.0)
MCH: 24.6 pg — ABNORMAL LOW (ref 26.0–34.0)
MCHC: 31.1 g/dL (ref 30.0–36.0)
MCV: 78.9 fL (ref 78.0–100.0)
Monocytes Absolute: 0.5 10*3/uL (ref 0.1–1.0)
Monocytes Relative: 5 % (ref 3–12)
Neutro Abs: 7.6 10*3/uL (ref 1.7–7.7)
Neutrophils Relative %: 73 % (ref 43–77)
Platelets: 277 10*3/uL (ref 150–400)
RBC: 5.21 MIL/uL — ABNORMAL HIGH (ref 3.87–5.11)
RDW: 15.2 % (ref 11.5–15.5)
WBC: 10.3 10*3/uL (ref 4.0–10.5)

## 2015-03-07 LAB — CBG MONITORING, ED: Glucose-Capillary: 112 mg/dL — ABNORMAL HIGH (ref 70–99)

## 2015-03-07 LAB — POC URINE PREG, ED: Preg Test, Ur: NEGATIVE

## 2015-03-07 NOTE — ED Notes (Addendum)
Pt. Is unable to use the restroom at this time, but is aware that we need a urine specimen.  

## 2015-03-07 NOTE — ED Notes (Signed)
Per [facility] Pt c/o of stomach cramping then c/o of dizziness and by stander witnessed syncopal episode lasting 5 minutes. Pt did not hit head. No trauma resulting from fall. Pt GCS 15. NEG STROKE. SCCA CLEARED.Marland Kitchen Upon arrival c/o nausea.

## 2015-03-07 NOTE — ED Provider Notes (Signed)
CSN: [id number]     Arrival date & time 03/07/15  1102 History   First MD Initiated Contact with Patient 03/07/15 1120     Chief Complaint  Patient presents with  . Loss of Consciousness  . Dizziness  . Nausea  . Abdominal Cramping     (Consider location/radiation/quality/duration/timing/severity/associated sxs/prior Treatment) HPI Comments: Patient presents today with a chief complaint of syncope.  She states that syncopal episode occurred earlier today.  She reports that she was having some crampy abdominal pain and stood up to walk to the bathroom.  While walking to the bathroom she began to feel lightheaded and vision became blurred.  She then had a syncopal episode.  She denies hitting her head.  She estimates that she loss consciousness for approximately 5 minutes.  No seizure activity.  She reports that she is asymptomatic at this time.  No headache, CP, SOB, nausea, vomiting, urinary symptoms, fever, or chills.  She reports that she had an episode of diarrhea and the abdominal cramping resolved.  No abdominal pain at this time.    Patient is a 29 y.o. female presenting with syncope, dizziness, and cramps. The history is provided by the patient.  Loss of Consciousness Associated symptoms: dizziness   Dizziness Associated symptoms: syncope   Abdominal Cramping    Past Medical History  Diagnosis Date  . Anemia     otc iron supplement  . GERD (gastroesophageal reflux disease)     otc reflux reducer prn-has not used in last month-assoc with foods  . Fibroid   . Infection     UTI   Past Surgical History  Procedure Laterality Date  . Mouth surgery    . Laparotomy  07/17/2012    Procedure: EXPLORATORY LAPAROTOMY;  Surgeon: Avel Sensor, MD;  Location: [facility];  Service: Gynecology;  Laterality: N/A;   Family History  Problem Relation Age of Onset  . Diabetes Father   . Asthma Brother   . Diabetes Maternal Grandmother   . Hypertension Maternal Grandfather   . Cancer  Maternal Grandfather     prostate  . Vision loss Maternal Grandfather   . Diabetes Paternal Grandmother   . Hearing loss Neg Hx    History  Substance Use Topics  . Smoking status: Never Smoker   . Smokeless tobacco: Never Used  . Alcohol Use: Yes     Comment: rare   OB History    Gravida Para Term Preterm AB TAB SAB Ectopic Multiple Living   0              Review of Systems  Cardiovascular: Positive for syncope.  Neurological: Positive for dizziness.  All other systems reviewed and are negative.     Allergies  Percocet  Home Medications   Prior to Admission medications   Medication Sig Start Date End Date Taking? Authorizing Provider  bismuth subsalicylate (PEPTO BISMOL) 262 MG/15ML suspension Take 30 mLs by mouth every 6 (six) hours as needed for indigestion.    Historical Provider, MD  famotidine (PEPCID) 20 MG tablet Take 40 mg by mouth daily as needed for heartburn or indigestion.    Historical Provider, MD  fluconazole (DIFLUCAN) 150 MG tablet Take 1 tablet (150 mg total) by mouth daily. Patient not taking: Reported on 11/28/2014 11/17/13   Lezlie Lye, NP  metroNIDAZOLE (FLAGYL) 500 MG tablet Take 1 tablet (500 mg total) by mouth 2 (two) times daily. Patient not taking: Reported on 11/28/2014 11/17/13   Lezlie Lye,  NP  miconazole (MICOTIN) 2 % cream Apply 1 application topically at bedtime as needed (For itching.).    Historical Provider, MD  Turmeric POWD Apply 2.5 mLs topically daily.    Historical Provider, MD   BP 122/71 mmHg  Pulse 70  Temp(Src) 98.1 F (36.7 C) (Oral)  Resp 24  SpO2 99% Physical Exam  Constitutional: She appears well-developed and well-nourished.  HENT:  Head: Normocephalic and atraumatic.  Mouth/Throat: Oropharynx is clear and moist.  Eyes: EOM are normal. Pupils are equal, round, and reactive to light.  Neck: Normal range of motion. Neck supple.  Cardiovascular: Normal rate, regular rhythm and normal heart sounds.    Pulmonary/Chest: Effort normal and breath sounds normal.  Abdominal: Soft. Bowel sounds are normal. She exhibits no distension and no mass. There is no tenderness. There is no rebound and no guarding.  Musculoskeletal: Normal range of motion.  Neurological: She is alert. She has normal strength. No cranial nerve deficit or sensory deficit. Coordination and gait normal.  Normal gait, no ataxia Normal finger to nose testing Normal rapid alternating movements.  Skin: Skin is warm and dry.  Psychiatric: She has a normal mood and affect.  Nursing note and vitals reviewed.   ED Course  Procedures (including critical care time) Labs Review Labs Reviewed  CBC WITH DIFFERENTIAL/PLATELET  BASIC METABOLIC PANEL  CBG MONITORING, ED  POC URINE PREG, ED    Imaging Review No results found.   EKG Interpretation   Date/Time:  Monday March 07 2015 11:38:11 EDT Ventricular Rate:  74 PR Interval:  300 QRS Duration: 86 QT Interval:  392 QTC Calculation: 435 R Axis:   59 Text Interpretation:  Sinus rhythm Prolonged PR interval ED PHYSICIAN  INTERPRETATION AVAILABLE IN [facility] Confirmed by TEST, Record  ([id number]) on 03/09/2015 6:45:52 AM      MDM   Final diagnoses:  None   Patient presents today after a syncopal episode that occurred earlier today.  She states that she felt dizzy and vision was blurred prior to passing out.  Vital signs stable.  She is not orthostatic.  Labs unremarkable.  Urine pregnancy negative.  No signs of head trauma and normal neurological exam.  Therefore, do not feel that imaging is indicated at this time.  EKG showing a first degree heart block, but otherwise unremarkable.  EKG also reviewed by Dr. Roderic Palau.  Patient asymptomatic in the ED.  Feel that the patient is stable for discharge.  Patient given referral to Cardiology.  Stable for discharge.  Return precautions given.     Hyman Bible, PA-C 03/09/15 2115  Milton Ferguson, MD 03/09/15 2129

## 2015-03-07 NOTE — ED Notes (Signed)
Bed: WA08 Expected date:  Expected time:  Means of arrival:  Comments: EMS- syncope

## 2015-03-23 ENCOUNTER — Encounter: Payer: Self-pay | Admitting: Family Medicine

## 2015-03-23 DIAGNOSIS — D649 Anemia, unspecified: Secondary | ICD-10-CM | POA: Insufficient documentation

## 2015-03-23 DIAGNOSIS — I440 Atrioventricular block, first degree: Secondary | ICD-10-CM | POA: Insufficient documentation

## 2015-04-06 ENCOUNTER — Encounter: Payer: Self-pay | Admitting: Physician Assistant

## 2015-04-06 ENCOUNTER — Ambulatory Visit (INDEPENDENT_AMBULATORY_CARE_PROVIDER_SITE_OTHER): Payer: 59 | Admitting: Physician Assistant

## 2015-04-06 VITALS — BP 114/80 | HR 84 | Temp 98.0°F | Resp 18 | Ht 65.0 in | Wt 251.0 lb

## 2015-04-06 DIAGNOSIS — D259 Leiomyoma of uterus, unspecified: Secondary | ICD-10-CM | POA: Diagnosis not present

## 2015-04-06 DIAGNOSIS — Z23 Encounter for immunization: Secondary | ICD-10-CM | POA: Diagnosis not present

## 2015-04-06 DIAGNOSIS — Z0000 Encounter for general adult medical examination without abnormal findings: Secondary | ICD-10-CM | POA: Diagnosis not present

## 2015-04-06 NOTE — Progress Notes (Signed)
Patient ID: Katie Medina MRN: [id number], DOB: 07-01-1986, 29 y.o. Date of Encounter: 04/06/2015,   Chief Complaint: Physical (CPE)  HPI: 29 y.o. y/o AA female  here for CPE.   Also, she is being seen as a new patient to establish care with our office today.  She states that for a period of time she was without health insurance so she saw no medical provider since September 2013.  Prior to that time she saw Dr. Nyoka Cowden an OB/GYN in [facility]. Says that she had uterine fibroids. Says that she was only seeing Dr. Nyoka Cowden and was not seeing a primary care provider even back then.  Today she reports that she is going to need follow-up with an OB/GYN because she is trying to get pregnant and is having difficulty getting pregnant. Also is going to need follow-up with them because of her history of fibroids. She states that Dr. Nyoka Cowden is no longer practicing. She is okay to go to any OB/GYN and has no preference.  No other concerns or complaints today.   Review of Systems: Consitutional: No fever, chills, fatigue, night sweats, lymphadenopathy. No significant/unexplained weight changes. Eyes: No visual changes, eye redness, or discharge. ENT/Mouth: No ear pain, sore throat, nasal drainage, or sinus pain. Cardiovascular: No chest pressure,heaviness, tightness or squeezing, even with exertion. No increased shortness of breath or dyspnea on exertion.No palpitations, edema, orthopnea, PND. Respiratory: No cough, hemoptysis, SOB, or wheezing. Gastrointestinal: No anorexia, dysphagia, reflux, pain, nausea, vomiting, hematemesis, diarrhea, constipation, BRBPR, or melena. Breast: No mass, nodules, bulging, or retraction. No skin changes or inflammation. No nipple discharge. No lymphadenopathy. Genitourinary: No dysuria, hematuria, incontinence, vaginal discharge, pruritis, burning, abnormal bleeding, or pain. Musculoskeletal: No decreased ROM, No joint pain or swelling. No significant pain in  neck, back, or extremities. Skin: No rash, pruritis, or concerning lesions. Neurological: No headache, dizziness, syncope, seizures, tremors, memory loss, coordination problems, or paresthesias. Psychological: No anxiety, depression, hallucinations, SI/HI. Endocrine: No polydipsia, polyphagia, polyuria, or known diabetes.No increased fatigue. No palpitations/rapid heart rate. No significant/unexplained weight change. All other systems were reviewed and are otherwise negative.  Past Medical History  Diagnosis Date  . GERD (gastroesophageal reflux disease)     otc reflux reducer prn-has not used in last month-assoc with foods  . Fibroid   . Infection     UTI  . Anemia     otc iron supplement  . AV block, 1st degree      Past Surgical History  Procedure Laterality Date  . Mouth surgery    . Laparotomy  07/17/2012    Procedure: EXPLORATORY LAPAROTOMY;  Surgeon: Avel Sensor, MD;  Location: [facility];  Service: Gynecology;  Laterality: N/A;  . Myomectomy  08/2012    Home Meds:  Outpatient Prescriptions Prior to Visit  Medication Sig Dispense Refill  . fluconazole (DIFLUCAN) 150 MG tablet Take 1 tablet (150 mg total) by mouth daily. (Patient not taking: Reported on 11/28/2014) 1 tablet 0  . metroNIDAZOLE (FLAGYL) 500 MG tablet Take 1 tablet (500 mg total) by mouth 2 (two) times daily. (Patient not taking: Reported on 11/28/2014) 14 tablet 0   No facility-administered medications prior to visit.    Allergies:  Allergies  Allergen Reactions  . Penicillins   . Percocet [Oxycodone-Acetaminophen] Other (See Comments)    Causes back pain.    History   Social History  . Marital Status: Married    Spouse Name: N/A  . Number of Children: N/A  .  Years of Education: N/A   Occupational History  . Not on file.   Social History Main Topics  . Smoking status: Never Smoker   . Smokeless tobacco: Never Used  . Alcohol Use: 1.2 - 2.4 oz/week    1-2 Glasses of wine, 1-2 Shots of  liquor per week     Comment: usally specilal occasions  . Drug Use: No  . Sexual Activity: Yes    Birth Control/ Protection: None   Other Topics Concern  . Not on file   Social History Narrative   Entered 03/2015:   Lives with her spouse. Has no children   Works Full Time--Medical Claims at [employer]    Family History  Problem Relation Age of Onset  . Diabetes Father   . Asthma Brother   . Diabetes Maternal Grandmother   . Hypertension Maternal Grandfather   . Cancer Maternal Grandfather     prostate  . Vision loss Maternal Grandfather   . Diabetes Paternal Grandmother   . Hearing loss Neg Hx   . HIV/AIDS Mother   . Diabetes Brother   . Diabetes Brother     Physical Exam: Blood pressure 114/80, pulse 84, temperature 98 F (36.7 C), temperature source Oral, resp. rate 18, height 5\' 5"  (1.651 m), weight 251 lb (113.853 kg)., Body mass index is 41.77 kg/(m^2). General: Obese AAF. Appears in no acute distress. HEENT: Normocephalic, atraumatic. Conjunctiva pink, sclera non-icteric. Pupils 2 mm constricting to 1 mm, round, regular, and equally reactive to light and accomodation. EOMI. Internal auditory canal clear. TMs with good cone of light and without pathology. Nasal mucosa pink. Nares are without discharge. No sinus tenderness. Oral mucosa pink.  Pharynx without exudate.   Neck: Supple. Trachea midline. No thyromegaly. Full ROM. No lymphadenopathy.No Carotid Bruits. Lungs: Clear to auscultation bilaterally without wheezes, rales, or rhonchi. Breathing is of normal effort and unlabored. Cardiovascular: RRR with S1 S2. No murmurs, rubs, or gallops. Distal pulses 2+ symmetrically. No carotid or abdominal bruits. Breast: Per Gyn. Abdomen: Soft, non-tender, non-distended with normoactive bowel sounds. No hepatosplenomegaly or masses. No rebound/guarding. No CVA tenderness. No hernias.  Genitourinary:  Per Gyn. Musculoskeletal: Full range of motion and 5/5 strength throughout.  Without swelling, atrophy, tenderness, crepitus, or warmth. Extremities without clubbing, cyanosis, or edema.  Skin: Warm and moist without erythema, ecchymosis, wounds, or rash. Neuro: A+Ox3. CN II-XII grossly intact. Moves all extremities spontaneously. Full sensation throughout. Normal gait. DTR 2+ throughout upper and lower extremities.  Psych:  Responds to questions appropriately with a normal affect.   Assessment/Plan:  29 y.o. y/o female here for CPE  1. Visit for preventive health examination  A. Screening Labs:  - She is not fasting today but states that she can return fasting for lab work within the next week. We'll follow-up with her once we get those results. - CBC with Differential/Platelet; Future - COMPLETE METABOLIC PANEL WITH GFR; Future - Lipid panel; Future - TSH; Future - Vit D  25 hydroxy (rtn osteoporosis monitoring); Future  B. Pap: Per Gyn - Ambulatory referral to Obstetrics / Gynecology  C. Screening Mammogram: Not indicated until age 25  D. DEXA/BMD:  Not indicated at this time  E. Colorectal Cancer Screening: She has no indication to need this until age 45  F. Immunizations:  Influenza:--------------N/A Tetanus:--------------Has been 10 years since last Tetanus. Pt agreeable to update today. T dap given here 04/06/2015 Pneumococcal:------ she has no indication to need this until age 40 Zostavax:-------------- no indication for  this until age 49    2. Uterine leiomyoma, unspecified location - Ambulatory referral to Obstetrics / Gynecology  3. Need for Tdap vaccination - Tdap vaccine greater than or equal to 7yo IM    Signed, [address] [place], [place], [facility] 04/06/2015 4:45 PM

## 2015-04-08 ENCOUNTER — Institutional Professional Consult (permissible substitution): Payer: 59 | Admitting: Cardiology

## 2015-04-08 ENCOUNTER — Other Ambulatory Visit: Payer: 59

## 2015-04-08 DIAGNOSIS — Z0000 Encounter for general adult medical examination without abnormal findings: Secondary | ICD-10-CM

## 2015-04-27 ENCOUNTER — Telehealth: Payer: Self-pay | Admitting: Family Medicine

## 2015-04-27 NOTE — Progress Notes (Signed)
Call pt and tell her that we need to do this full panel of FASTING screening labs. Our lab was unable to get blood draw successfully. See if we can arrange for her to go to the hospital to have lab draw or figure out some plan for getting these labs drawn.

## 2015-04-27 NOTE — Telephone Encounter (Signed)
Pt was here for CPE 04/06/15.  Still has not returned for fasting lab work.  Have call into patient to remind her she needs to have done.

## 2015-05-05 ENCOUNTER — Encounter: Payer: Self-pay | Admitting: Family Medicine

## 2015-05-05 NOTE — Telephone Encounter (Signed)
Pt not responding to phone calls.  Have sent her a letter to come have labs done.

## 2015-05-20 ENCOUNTER — Other Ambulatory Visit: Payer: Self-pay | Admitting: Family Medicine

## 2015-05-20 DIAGNOSIS — Z0000 Encounter for general adult medical examination without abnormal findings: Secondary | ICD-10-CM

## 2015-05-20 DIAGNOSIS — D649 Anemia, unspecified: Secondary | ICD-10-CM

## 2015-06-03 ENCOUNTER — Telehealth: Payer: Self-pay | Admitting: Physician Assistant

## 2015-06-03 NOTE — Telephone Encounter (Signed)
Patient returning call regarding referral [phone]

## 2015-06-06 NOTE — Telephone Encounter (Signed)
Pt states she has an appointment scheduled with [facility] ob/gyn in July for her referral and did not wants to go to [place], referral was closed and documented as refused this referral

## 2016-04-09 ENCOUNTER — Other Ambulatory Visit: Payer: 59

## 2016-04-11 ENCOUNTER — Other Ambulatory Visit: Payer: Self-pay | Admitting: Physician Assistant

## 2016-04-11 ENCOUNTER — Encounter: Payer: Self-pay | Admitting: Physician Assistant

## 2016-04-11 ENCOUNTER — Ambulatory Visit (INDEPENDENT_AMBULATORY_CARE_PROVIDER_SITE_OTHER): Payer: Commercial Managed Care - HMO | Admitting: Physician Assistant

## 2016-04-11 VITALS — BP 114/76 | HR 80 | Temp 97.8°F | Resp 18 | Ht 64.5 in | Wt 237.0 lb

## 2016-04-11 DIAGNOSIS — Z0000 Encounter for general adult medical examination without abnormal findings: Secondary | ICD-10-CM

## 2016-04-11 DIAGNOSIS — Z111 Encounter for screening for respiratory tuberculosis: Secondary | ICD-10-CM

## 2016-04-11 LAB — COMPLETE METABOLIC PANEL WITH GFR
ALT: 8 U/L (ref 6–29)
AST: 13 U/L (ref 10–30)
Albumin: 3.8 g/dL (ref 3.6–5.1)
Alkaline Phosphatase: 76 U/L (ref 33–115)
BUN: 5 mg/dL — ABNORMAL LOW (ref 7–25)
CO2: 25 mmol/L (ref 20–31)
Calcium: 9 mg/dL (ref 8.6–10.2)
Chloride: 103 mmol/L (ref 98–110)
Creat: 0.88 mg/dL (ref 0.50–1.10)
GFR, Est African American: >89 mL/min (ref >=60)
GFR, Est Non African American: 88 mL/min (ref >=60)
Glucose, Bld: 89 mg/dL (ref 70–99)
Potassium: 4.2 mmol/L (ref 3.5–5.3)
Sodium: 136 mmol/L (ref 135–146)
Total Bilirubin: 0.4 mg/dL (ref 0.2–1.2)
Total Protein: 7.4 g/dL (ref 6.1–8.1)

## 2016-04-11 LAB — CBC WITH DIFFERENTIAL/PLATELET
Basophils Absolute: 0 cells/uL (ref 0–200)
Basophils Relative: 0 %
Eosinophils Absolute: 106 cells/uL (ref 15–500)
Eosinophils Relative: 1 %
HCT: 32.2 % — ABNORMAL LOW (ref 35.0–45.0)
Hemoglobin: 9.5 g/dL — ABNORMAL LOW (ref 12.0–15.0)
Lymphocytes Relative: 20 %
Lymphs Abs: 2120 cells/uL (ref 850–3900)
MCH: 19.7 pg — ABNORMAL LOW (ref 27.0–33.0)
MCHC: 29.5 g/dL — ABNORMAL LOW (ref 32.0–36.0)
MCV: 66.8 fL — ABNORMAL LOW (ref 80.0–100.0)
MPV: 9.1 fL (ref 7.5–12.5)
Monocytes Absolute: 424 cells/uL (ref 200–950)
Monocytes Relative: 4 %
Neutro Abs: 7950 cells/uL — ABNORMAL HIGH (ref 1500–7800)
Neutrophils Relative %: 75 %
Platelets: 383 10*3/uL (ref 140–400)
RBC: 4.82 MIL/uL (ref 3.80–5.10)
RDW: 19.5 % — ABNORMAL HIGH (ref 11.0–15.0)
WBC: 10.6 10*3/uL (ref 3.8–10.8)

## 2016-04-11 LAB — TSH: TSH: 1.29 mIU/L

## 2016-04-11 LAB — LIPID PANEL
Cholesterol: 147 mg/dL (ref 125–200)
HDL: 47 mg/dL (ref >=46)
LDL Cholesterol: 89 mg/dL (ref <130)
Total CHOL/HDL Ratio: 3.1 Ratio (ref <=5.0)
Triglycerides: 54 mg/dL (ref <150)
VLDL: 11 mg/dL (ref <30)

## 2016-04-11 NOTE — Progress Notes (Signed)
Patient ID: Katie Medina MRN: [id number], DOB: 12/14/1986, 30 y.o. Date of Encounter: 04/11/2016,   Chief Complaint: Physical (CPE)  HPI: 30 y.o. y/o AA female  here for CPE.   04/06/2015: She is here for CPE. Also, she is being seen as a new patient to establish care with our office today.  She states that for a period of time she was without health insurance so she saw no medical provider since September 2013.  Prior to that time she saw Dr. Nyoka Cowden an OB/GYN in [facility]. Says that she had uterine fibroids. Says that she was only seeing Dr. Nyoka Cowden and was not seeing a primary care provider even back then.  Today she reports that she is going to need follow-up with an OB/GYN because she is trying to get pregnant and is having difficulty getting pregnant. Also is going to need follow-up with them because of her history of fibroids. She states that Dr. Nyoka Cowden is no longer practicing. She is okay to go to any OB/GYN and has no preference.  No other concerns or complaints today.   04/11/2016: She states that she has continued to follow-up with gynecology. Has a procedure scheduled for June to remove fibroids. Also GYN has her on metformin.  She says that in March 2016 she passed out and EKG showed first-degree AV block she had follow-up with cardiology and was told everything was okay. She was told to follow-up with cardiology only PRN.  Says that she was having problems with constipation. Says that she use MiraLAX but then was able to stop that. Says that the metformin is actually helping with this and keeping this controlled now.  Says that she has no other medical updates. No concerns or complaints today. Has form with her that needs to be completed as an employee for [employer]. Has to have TB skin test as part of this.  Review of Systems: Consitutional: No fever, chills, fatigue, night sweats, lymphadenopathy. No significant/unexplained weight  changes. Eyes: No visual changes, eye redness, or discharge. ENT/Mouth: No ear pain, sore throat, nasal drainage, or sinus pain. Cardiovascular: No chest pressure,heaviness, tightness or squeezing, even with exertion. No increased shortness of breath or dyspnea on exertion.No palpitations, edema, orthopnea, PND. Respiratory: No cough, hemoptysis, SOB, or wheezing. Gastrointestinal: No anorexia, dysphagia, reflux, pain, nausea, vomiting, hematemesis, diarrhea, constipation, BRBPR, or melena. Breast: No mass, nodules, bulging, or retraction. No skin changes or inflammation. No nipple discharge. No lymphadenopathy. Genitourinary: No dysuria, hematuria, incontinence, vaginal discharge, pruritis, burning, abnormal bleeding, or pain. Musculoskeletal: No decreased ROM, No joint pain or swelling. No significant pain in neck, back, or extremities. Skin: No rash, pruritis, or concerning lesions. Neurological: No headache, dizziness, syncope, seizures, tremors, memory loss, coordination problems, or paresthesias. Psychological: No anxiety, depression, hallucinations, SI/HI. Endocrine: No polydipsia, polyphagia, polyuria, or known diabetes.No increased fatigue. No palpitations/rapid heart rate. No significant/unexplained weight change. All other systems were reviewed and are otherwise negative.  Past Medical History  Diagnosis Date  . GERD (gastroesophageal reflux disease)     otc reflux reducer prn-has not used in last month-assoc with foods  . Fibroid   . Infection     UTI  . Anemia     otc iron supplement  . AV block, 1st degree      Past Surgical History  Procedure Laterality Date  . Mouth surgery    . Laparotomy  07/17/2012    Procedure: EXPLORATORY LAPAROTOMY;  Surgeon: Avel Sensor, MD;  Location: [facility];  Service: Gynecology;  Laterality: N/A;  . Myomectomy  08/2012    Home Meds:  Outpatient Prescriptions Prior to Visit  Medication Sig Dispense Refill  . polyethylene glycol  (MIRALAX / GLYCOLAX) packet Take 17 g by mouth daily as needed. Reported on 04/11/2016     No facility-administered medications prior to visit.    Allergies:  Allergies  Allergen Reactions  . Penicillins   . Percocet [Oxycodone-Acetaminophen] Other (See Comments)    Causes back pain.    Social History   Social History  . Marital Status: Married    Spouse Name: N/A  . Number of Children: N/A  . Years of Education: N/A   Occupational History  . Not on file.   Social History Main Topics  . Smoking status: Never Smoker   . Smokeless tobacco: Never Used  . Alcohol Use: 1.2 - 2.4 oz/week    1-2 Glasses of wine, 1-2 Shots of liquor per week     Comment: usally specilal occasions  . Drug Use: No  . Sexual Activity: Yes    Birth Control/ Protection: None   Other Topics Concern  . Not on file   Social History Narrative   Entered 03/2015:   Lives with her spouse. Has no children   Works Full Time--Medical Claims at [employer]    Family History  Problem Relation Age of Onset  . Diabetes Father   . Asthma Brother   . Diabetes Maternal Grandmother   . Hypertension Maternal Grandfather   . Cancer Maternal Grandfather     prostate  . Vision loss Maternal Grandfather   . Diabetes Paternal Grandmother   . Hearing loss Neg Hx   . HIV/AIDS Mother   . Diabetes Brother   . Diabetes Brother     Physical Exam: Blood pressure 114/76, pulse 80, temperature 97.8 F (36.6 C), temperature source Oral, resp. rate 18, height 5' 4.5" (1.638 m), weight 237 lb (107.502 kg)., Body mass index is 40.07 kg/(m^2). General: Obese AAF. Appears in no acute distress. HEENT: Normocephalic, atraumatic. Conjunctiva pink, sclera non-icteric. Pupils 2 mm constricting to 1 mm, round, regular, and equally reactive to light and accomodation. EOMI. Internal auditory canal clear. TMs with good cone of light and without pathology. Nasal mucosa pink. Nares are without discharge. No sinus tenderness. Oral mucosa  pink.  Pharynx without exudate.   Neck: Supple. Trachea midline. No thyromegaly. Full ROM. No lymphadenopathy.No Carotid Bruits. Lungs: Clear to auscultation bilaterally without wheezes, rales, or rhonchi. Breathing is of normal effort and unlabored. Cardiovascular: RRR with S1 S2. No murmurs, rubs, or gallops. Distal pulses 2+ symmetrically. No carotid or abdominal bruits. Breast: Per Gyn. Abdomen: Soft, non-tender, non-distended with normoactive bowel sounds. No hepatosplenomegaly or masses. No rebound/guarding. No CVA tenderness. No hernias.  Genitourinary:  Per Gyn. Musculoskeletal: Full range of motion and 5/5 strength throughout. Without swelling, atrophy, tenderness, crepitus, or warmth. Extremities without clubbing, cyanosis, or edema.  Skin: Warm and moist without erythema, ecchymosis, wounds, or rash. Neuro: A+Ox3. CN II-XII grossly intact. Moves all extremities spontaneously. Full sensation throughout. Normal gait. DTR 2+ throughout upper and lower extremities.  Psych:  Responds to questions appropriately with a normal affect.    Hearing and Vision Screens:  These are documented in their sections of epic. She had some hearing loss at certain frequencies bilaterally. Recommend follow-up with audiologist or ENT. Vision screen shows vision 20/40 with one eye and 20/30 with the other eye and 20/20 with both eyes.  Recommend f/u with optometrist.  Assessment/Plan:  31 y.o. y/o female here for CPE  1. Visit for preventive health examination  A. Screening Labs:  - She is fasting today. - CBC with Differential/Platelet; - COMPLETE METABOLIC PANEL WITH GFR;  - Lipid panel;  - TSH;  - Vit D  25 hydroxy (rtn osteoporosis monitoring);   B. Pap: Per Gyn   C. Screening Mammogram: Not indicated until age 25  D. DEXA/BMD:  Not indicated at this time  E. Colorectal Cancer Screening: She has no indication to need this until age 53  F. Immunizations:   Influenza:--------------N/A Tetanus:-------------- T dap given here 04/06/2015 Pneumococcal:------ she has no indication to need this until age 58 Zostavax:-------------- no indication for this until age 39  Recommend follow-up with optometrist and possibly with audiology. TB skin test placed today and she is to return Friday to read this result. Form completed for employee [employer].   [address] [place], [place], [facility] 04/11/2016 11:39 AM

## 2016-04-12 LAB — VITAMIN D 25 HYDROXY (VIT D DEFICIENCY, FRACTURES): Vit D, 25-Hydroxy: 17 ng/mL — ABNORMAL LOW (ref 30–100)

## 2016-04-13 ENCOUNTER — Ambulatory Visit: Payer: Commercial Managed Care - HMO | Admitting: Family Medicine

## 2016-04-13 DIAGNOSIS — Z111 Encounter for screening for respiratory tuberculosis: Secondary | ICD-10-CM

## 2016-04-13 LAB — TB SKIN TEST
Induration: 0 mm
TB Skin Test: NEGATIVE

## 2016-04-13 NOTE — Progress Notes (Signed)
Pt returns for reading of PPD TB skin test.  No redness or induration noted.  Result edited and copy given to pt.

## 2016-04-14 LAB — IRON,TIBC AND FERRITIN PANEL
%SAT: 3 % — ABNORMAL LOW (ref 11–50)
Ferritin: 12 ng/mL (ref 10–154)
Iron: 12 ug/dL — ABNORMAL LOW (ref 40–190)
TIBC: 432 ug/dL (ref 250–450)

## 2016-04-16 ENCOUNTER — Other Ambulatory Visit: Payer: Self-pay | Admitting: Family Medicine

## 2016-04-16 MED ORDER — POLYSACCHARIDE IRON COMPLEX 150 MG PO CAPS: 150.0000 mg | ORAL_CAPSULE | Freq: Two times a day (BID) | ORAL | Status: DC

## 2016-04-16 MED ORDER — CHOLECALCIFEROL 50 MCG (2000 UT) PO CAPS: 1.0000 | ORAL_CAPSULE | Freq: Every day | ORAL | Status: DC

## 2016-06-19 ENCOUNTER — Encounter (HOSPITAL_BASED_OUTPATIENT_CLINIC_OR_DEPARTMENT_OTHER): Payer: Self-pay

## 2016-06-19 ENCOUNTER — Ambulatory Visit (HOSPITAL_BASED_OUTPATIENT_CLINIC_OR_DEPARTMENT_OTHER): Admit: 2016-06-19 | Payer: 59 | Admitting: Obstetrics and Gynecology

## 2016-06-19 SURGERY — DILATATION & CURETTAGE/HYSTEROSCOPY WITH MYOSURE
Anesthesia: General

## 2017-01-04 DIAGNOSIS — N63 Unspecified lump in unspecified breast: Secondary | ICD-10-CM | POA: Diagnosis not present

## 2017-01-14 DIAGNOSIS — N6313 Unspecified lump in the right breast, lower outer quadrant: Secondary | ICD-10-CM | POA: Diagnosis not present

## 2017-01-14 DIAGNOSIS — N63 Unspecified lump in unspecified breast: Secondary | ICD-10-CM | POA: Diagnosis not present

## 2017-01-14 DIAGNOSIS — N6310 Unspecified lump in the right breast, unspecified quadrant: Secondary | ICD-10-CM | POA: Diagnosis not present

## 2017-01-17 ENCOUNTER — Ambulatory Visit: Payer: Commercial Managed Care - HMO | Admitting: Physician Assistant

## 2017-03-07 ENCOUNTER — Ambulatory Visit (INDEPENDENT_AMBULATORY_CARE_PROVIDER_SITE_OTHER): Payer: Commercial Managed Care - HMO | Admitting: Physician Assistant

## 2017-03-07 ENCOUNTER — Encounter: Payer: Self-pay | Admitting: Physician Assistant

## 2017-03-07 VITALS — BP 124/80 | HR 85 | Temp 98.2°F | Resp 16 | Wt 246.0 lb

## 2017-03-07 DIAGNOSIS — R5383 Other fatigue: Secondary | ICD-10-CM

## 2017-03-07 DIAGNOSIS — D509 Iron deficiency anemia, unspecified: Secondary | ICD-10-CM | POA: Diagnosis not present

## 2017-03-07 DIAGNOSIS — N912 Amenorrhea, unspecified: Secondary | ICD-10-CM

## 2017-03-07 LAB — PREGNANCY, URINE: Preg Test, Ur: NEGATIVE

## 2017-03-07 NOTE — Progress Notes (Signed)
Patient ID: Katie Medina MRN: [id number], DOB: [birth year], 31 y.o. Date of Encounter: @DATE @  Chief Complaint:  Chief Complaint  Patient presents with  . sluggish  . Fatigue    HPI: 31 y.o. year old female  presents with above.   Says that she has been feeling a little more fatigued recently and decreased energy compared to usual. Says that she was checking on the Internet and thyroid could cause similar symptoms as what she's been feeling so wanted to come check her thyroid. That she just feels like she doesn't have as much energy as she used to. Says that even after she has slept at night when she wakes up she feels tired and just doesn't feel as energetic as she used to.  Reviewed that she have physical with me 04/11/16 labs at that time revealed anemia and iron deficiency. I prescribed Nu-Iron 150 milligrams twice a day. She states that she remembers that the prescription was going to be expensive so she started over-the-counter iron at that time and has been taking over-the-counter iron since then.  So noted that her problem list includes uterine fibroid. Asked her if she has a lot of blood loss related to this. She says that in the past her menses were heavy-- especially the first 3 days of each period--would be heavy. However says that she has not had any real menses the past 3 months. Had treatment by GYN regarding fibroids.   Past Medical History:  Diagnosis Date  . Anemia    otc iron supplement  . AV block, 1st degree   . Fibroid   . GERD (gastroesophageal reflux disease)    otc reflux reducer prn-has not used in last month-assoc with foods  . Infection    UTI     Home Meds: Outpatient Medications Prior to Visit  Medication Sig Dispense Refill  . iron polysaccharides (NIFEREX) 150 MG capsule Take 1 capsule (150 mg total) by mouth 2 (two) times daily. 60 capsule 5  . Cholecalciferol 2000 units CAPS Take 1 capsule (2,000 Units total) by mouth daily. (Patient not  taking: Reported on 03/07/2017)    . metFORMIN (GLUCOPHAGE) 1000 MG tablet Take 1 tablet by mouth daily.    . Multiple Vitamins-Iron (MULTIVITAMIN/IRON PO) Take 1 tablet by mouth daily.    . polyethylene glycol (MIRALAX / GLYCOLAX) packet Take 17 g by mouth daily as needed. Reported on 04/11/2016     No facility-administered medications prior to visit.     Allergies:  Allergies  Allergen Reactions  . Penicillins   . Percocet [Oxycodone-Acetaminophen] Other (See Comments)    Causes back pain.    Social History   Social History  . Marital status: Married    Spouse name: N/A  . Number of children: N/A  . Years of education: N/A   Occupational History  . Not on file.   Social History Main Topics  . Smoking status: Never Smoker  . Smokeless tobacco: Never Used  . Alcohol use 1.2 - 2.4 oz/week    1 - 2 Glasses of wine, 1 - 2 Shots of liquor per week     Comment: usally specilal occasions  . Drug use: No  . Sexual activity: Yes    Birth control/ protection: None   Other Topics Concern  . Not on file   Social History Narrative   Entered 03/2015:   Lives with her spouse. Has no children   Works Full Time--Medical Claims at [employer]  Family History  Problem Relation Age of Onset  . Diabetes Father   . Asthma Brother   . Diabetes Maternal Grandmother   . Hypertension Maternal Grandfather   . Cancer Maternal Grandfather     prostate  . Vision loss Maternal Grandfather   . Diabetes Paternal Grandmother   . Hearing loss Neg Hx   . HIV/AIDS Mother   . Diabetes Brother   . Diabetes Brother      Review of Systems:  See HPI for pertinent ROS. All other ROS negative.    Physical Exam: Blood pressure 124/80, pulse 85, temperature 98.2 F (36.8 C), temperature source Oral, resp. rate 16, weight 246 lb (111.6 kg), last menstrual period 12/09/2016, SpO2 99 %., Body mass index is 41.57 kg/m. General: Obese AAF. Appears in no acute distress. Neck: Supple. No thyromegaly.  No lymphadenopathy. Lungs: Clear bilaterally to auscultation without wheezes, rales, or rhonchi. Breathing is unlabored. Heart: RRR with S1 S2. No murmurs, rubs, or gallops. Musculoskeletal:  Strength and tone normal for age. Extremities/Skin: Warm and dry.  Neuro: Alert and oriented X 3. Moves all extremities spontaneously. Gait is normal. CNII-XII grossly in tact. Psych:  Responds to questions appropriately with a normal affect.     ASSESSMENT AND PLAN:  31 y.o. year old female with  1. Fatigue, unspecified type - Anemia panel - TSH - Pregnancy, urine  2. Iron deficiency anemia, unspecified iron deficiency anemia type - Anemia panel  3. Amenorrhea - Pregnancy, urine  We'll obtain the above labs and test and will follow up with patient once get results. Also discussed sleep wondering whether insomnia could be contributing to her fatigue. She says she has had some problems with insomnia but uses melatonin and this works pretty well and does not think that an adequate sleep is causing symptoms.  Marin Olp [place], [place], [facility] 03/07/2017 3:02 PM

## 2017-03-08 LAB — ANEMIA PANEL
%SAT: 9 % — ABNORMAL LOW (ref 11–50)
ABS Retic: 71680 cells/uL (ref 20000–80000)
Ferritin: 27 ng/mL (ref 10–154)
Folate: 5.6 ng/mL (ref >5.4)
Iron: 37 ug/dL — ABNORMAL LOW (ref 40–190)
RBC.: 5.12 MIL/uL — ABNORMAL HIGH (ref 3.80–5.10)
Retic Ct Pct: 1.4 %
TIBC: 435 ug/dL (ref 250–450)
UIBC: 398 ug/dL (ref 125–400)
Vitamin B-12: 388 pg/mL (ref 200–1100)

## 2017-03-08 LAB — TSH: TSH: 1.06 mIU/L

## 2017-03-29 DIAGNOSIS — B351 Tinea unguium: Secondary | ICD-10-CM | POA: Diagnosis not present

## 2017-03-29 DIAGNOSIS — M25775 Osteophyte, left foot: Secondary | ICD-10-CM | POA: Diagnosis not present

## 2017-04-15 DIAGNOSIS — N6311 Unspecified lump in the right breast, upper outer quadrant: Secondary | ICD-10-CM | POA: Diagnosis not present

## 2017-04-15 DIAGNOSIS — N6489 Other specified disorders of breast: Secondary | ICD-10-CM | POA: Diagnosis not present

## 2017-04-15 DIAGNOSIS — N6459 Other signs and symptoms in breast: Secondary | ICD-10-CM | POA: Diagnosis not present

## 2017-04-15 DIAGNOSIS — N63 Unspecified lump in unspecified breast: Secondary | ICD-10-CM | POA: Diagnosis not present

## 2017-07-17 DIAGNOSIS — R928 Other abnormal and inconclusive findings on diagnostic imaging of breast: Secondary | ICD-10-CM | POA: Diagnosis not present

## 2017-07-17 DIAGNOSIS — N6311 Unspecified lump in the right breast, upper outer quadrant: Secondary | ICD-10-CM | POA: Diagnosis not present

## 2017-07-17 DIAGNOSIS — Z09 Encounter for follow-up examination after completed treatment for conditions other than malignant neoplasm: Secondary | ICD-10-CM | POA: Diagnosis not present

## 2017-11-11 ENCOUNTER — Ambulatory Visit: Payer: 59 | Admitting: Family Medicine

## 2017-11-12 DIAGNOSIS — R300 Dysuria: Secondary | ICD-10-CM | POA: Diagnosis not present

## 2017-11-13 ENCOUNTER — Ambulatory Visit: Payer: 59 | Admitting: Family Medicine

## 2017-11-13 ENCOUNTER — Encounter: Payer: Self-pay | Admitting: Family Medicine

## 2017-11-13 VITALS — BP 128/72 | HR 86 | Temp 98.5°F | Resp 16 | Ht 63.0 in | Wt 240.0 lb

## 2017-11-13 DIAGNOSIS — N760 Acute vaginitis: Secondary | ICD-10-CM

## 2017-11-13 DIAGNOSIS — N3000 Acute cystitis without hematuria: Secondary | ICD-10-CM | POA: Diagnosis not present

## 2017-11-13 LAB — URINALYSIS, ROUTINE W REFLEX MICROSCOPIC
Bacteria, UA: NONE SEEN /HPF
Bilirubin Urine: NEGATIVE
Glucose, UA: NEGATIVE
Ketones, ur: NEGATIVE
Leukocytes, UA: NEGATIVE
Nitrite: NEGATIVE
RBC / HPF: >=60 /HPF — AB (ref 0–2)
Specific Gravity, Urine: 1.025 (ref 1.001–1.03)
WBC, UA: NONE SEEN /HPF (ref 0–5)
pH: 6 (ref 5.0–8.0)

## 2017-11-13 LAB — WET PREP FOR TRICH, YEAST, CLUE

## 2017-11-13 LAB — MICROSCOPIC MESSAGE

## 2017-11-13 MED ORDER — NITROFURANTOIN MONOHYD MACRO 100 MG PO CAPS: 100.0000 mg | ORAL_CAPSULE | Freq: Two times a day (BID) | ORAL | 0 refills | Status: DC

## 2017-11-13 MED ORDER — FLUCONAZOLE 150 MG PO TABS: ORAL_TABLET | ORAL | 0 refills | Status: DC

## 2017-11-13 NOTE — Patient Instructions (Signed)
Take antibiotics Take yeast pill Recheck if not improved F/U as needed

## 2017-11-13 NOTE — Progress Notes (Signed)
   Subjective:    Patient ID: Katie Medina, female    DOB: 16-Mar-1986, 31 y.o.   MRN: [id number]  Patient presents for Dysuria (burning with urination) and Vagina Irritation (states that she has some itching in the vaginal area)  Pt here with vaginal itching on and off for the past 3 weeks.  She does have remote history of yeast infections and bacterial vaginosis.  She denies any vaginal discharge.  She is also had burning with urination and pressure for the past few days.  She is currently on her menstrual cycle started 3 days ago. No fever no vomiting no diarrhea.  She has used douche and some cranberry supplements    Review Of Systems:  GEN- denies fatigue, fever, weight loss,weakness, recent illness HEENT- denies eye drainage, change in vision, nasal discharge, CVS- denies chest pain, palpitations RESP- denies SOB, cough, wheeze ABD- denies N/V, change in stools, abd pain GU-+ dysuria, hematuria, dribbling, incontinence MSK- denies joint pain, muscle aches, injury Neuro- denies headache, dizziness, syncope, seizure activity       Objective:    BP 128/72   Pulse 86   Temp 98.5 F (36.9 C) (Oral)   Resp 16   Ht 5\' 3"  (1.6 m)   Wt 240 lb (108.9 kg)   SpO2 99%   BMI 42.51 kg/m  GEN- NAD, alert and oriented x3 CVS- RRR, no murmur RESP-CTAB ABD-NABS,soft,NT,ND, no cva tenderness GU- normal external genitalia, vaginal mucosa pink and moist, cervix visualized no growth, + blood from os and in vault ,no discharge seen, no CMT, no ovarian masses, uterus normal size Pulses- Radial 2+        Assessment & Plan:      Problem List Items Addressed This Visit    None    Visit Diagnoses    Acute cystitis without hematuria    -  Primary   based on symptoms treat for uncomplicated UTI , Macrobid 100mg  BID x 3 days, UA and Wet prep full of blood RBC so nothing in particular seen, given diflucan    Relevant Orders   Urinalysis, Routine w reflex microscopic (Completed)   Acute vaginitis       If symptoms do not resolve with above, return for repeat swabs/UA off of her menses   Relevant Orders   WET PREP FOR [place], YEAST, CLUE (Completed)   C. trachomatis/N. gonorrhoeae RNA      Note: This dictation was prepared with Dragon dictation along with smaller Company secretary. Any transcriptional errors that result from this process are unintentional.

## 2017-11-15 LAB — C. TRACHOMATIS/N. GONORRHOEAE RNA
C. trachomatis RNA, TMA: NOT DETECTED
N. gonorrhoeae RNA, TMA: NOT DETECTED

## 2017-11-25 ENCOUNTER — Telehealth: Payer: Self-pay | Admitting: Family Medicine

## 2017-11-25 MED ORDER — NITROFURANTOIN MONOHYD MACRO 100 MG PO CAPS: 100.0000 mg | ORAL_CAPSULE | Freq: Two times a day (BID) | ORAL | 0 refills | Status: DC

## 2017-11-25 MED ORDER — FLUCONAZOLE 150 MG PO TABS: ORAL_TABLET | ORAL | 0 refills | Status: DC

## 2017-11-25 NOTE — Telephone Encounter (Signed)
Prescription sent to pharmacy.

## 2017-11-25 NOTE — Telephone Encounter (Signed)
Pt states that pharmacy did not receive prescription for macrobid or diflucan can we call this in to the [facility] [address].

## 2018-02-24 DIAGNOSIS — R922 Inconclusive mammogram: Secondary | ICD-10-CM | POA: Diagnosis not present

## 2018-02-24 DIAGNOSIS — L720 Epidermal cyst: Secondary | ICD-10-CM | POA: Diagnosis not present

## 2018-02-24 DIAGNOSIS — R928 Other abnormal and inconclusive findings on diagnostic imaging of breast: Secondary | ICD-10-CM | POA: Diagnosis not present

## 2018-02-24 DIAGNOSIS — Z09 Encounter for follow-up examination after completed treatment for conditions other than malignant neoplasm: Secondary | ICD-10-CM | POA: Diagnosis not present

## 2018-05-15 ENCOUNTER — Encounter: Payer: Self-pay | Admitting: Physician Assistant

## 2018-05-15 ENCOUNTER — Other Ambulatory Visit: Payer: Self-pay

## 2018-05-15 ENCOUNTER — Ambulatory Visit (INDEPENDENT_AMBULATORY_CARE_PROVIDER_SITE_OTHER): Payer: 59 | Admitting: Physician Assistant

## 2018-05-15 VITALS — BP 128/80 | HR 86 | Temp 98.1°F | Resp 18 | Ht 64.0 in | Wt 228.4 lb

## 2018-05-15 DIAGNOSIS — D509 Iron deficiency anemia, unspecified: Secondary | ICD-10-CM | POA: Diagnosis not present

## 2018-05-15 DIAGNOSIS — Z0000 Encounter for general adult medical examination without abnormal findings: Secondary | ICD-10-CM

## 2018-05-15 NOTE — Progress Notes (Signed)
Patient ID: Katie Medina MRN: [id number], DOB: 17-Sep-1986, 32 y.o. Date of Encounter: 05/15/2018,   Chief Complaint: Physical (CPE)  HPI: 32 y.o. y/o female  here for CPE.   She reports that she is not fasting today but can return tomorrow fasting for labs. She also states that in the past her iron was low and she has been taking iron supplement twice a day over-the-counter and wants to recheck that level to monitor that.  She is not sure of the dose of her iron supplement that she is taking but has been taking it twice a day. She is going to a gynecologist in [facility].  States that she will go there for a visit in June.  She works with the [employer].  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  Says that she does not usually eat much during the day/morning so can come fasting after work tomorrow for her labs.  No other specific concerns to address today.   Review of Systems: Consitutional: No fever, chills, fatigue, night sweats, lymphadenopathy. No significant/unexplained weight changes. Eyes: No visual changes, eye redness, or discharge. ENT/Mouth: No ear pain, sore throat, nasal drainage, or sinus pain. Cardiovascular: No chest pressure,heaviness, tightness or squeezing, even with exertion. No increased shortness of breath or dyspnea on exertion.No palpitations, edema, orthopnea, PND. Respiratory: No cough, hemoptysis, SOB, or wheezing. Gastrointestinal: No anorexia, dysphagia, reflux, pain, nausea, vomiting, hematemesis, diarrhea, constipation, BRBPR, or melena. Breast: No mass, nodules, bulging, or retraction. No skin changes or inflammation. No nipple discharge. No lymphadenopathy. Genitourinary: No dysuria, hematuria, incontinence, vaginal discharge, pruritis, burning, abnormal bleeding, or pain. Musculoskeletal: No decreased ROM, No joint pain or swelling. No significant pain in neck, back, or extremities. Skin: No rash, pruritis, or  concerning lesions. Neurological: No headache, dizziness, syncope, seizures, tremors, memory loss, coordination problems, or paresthesias. Psychological: No anxiety, depression, hallucinations, SI/HI. Endocrine: No polydipsia, polyphagia, polyuria, or known diabetes.No increased fatigue. No palpitations/rapid heart rate. No significant/unexplained weight change. All other systems were reviewed and are otherwise negative.  Past Medical History:  Diagnosis Date  . Anemia    otc iron supplement  . AV block, 1st degree   . Fibroid   . GERD (gastroesophageal reflux disease)    otc reflux reducer prn-has not used in last month-assoc with foods  . Infection    UTI     Past Surgical History:  Procedure Laterality Date  . LAPAROTOMY  07/17/2012   Procedure: EXPLORATORY LAPAROTOMY;  Surgeon: Avel Sensor, MD;  Location: [facility];  Service: Gynecology;  Laterality: N/A;  . MOUTH SURGERY    . MYOMECTOMY  08/2012    Home Meds:  Outpatient Medications Prior to Visit  Medication Sig Dispense Refill  . Multiple Vitamins-Iron (MULTIVITAMIN/IRON PO) Take 1 tablet by mouth daily.    . nitrofurantoin, macrocrystal-monohydrate, (MACROBID) 100 MG capsule Take 1 capsule (100 mg total) by mouth 2 (two) times daily. 6 capsule 0  . fluconazole (DIFLUCAN) 150 MG tablet Take 1 tablet and repeat in 3 days 2 tablet 0   No facility-administered medications prior to visit.     Allergies:  Allergies  Allergen Reactions  . Penicillins   . Percocet [Oxycodone-Acetaminophen] Other (See Comments)    Causes back pain.    Social History   Socioeconomic History  . Marital status: Married    Spouse name: Not on file  . Number of children: Not on file  .  Years of education: Not on file  . Highest education level: Not on file  Occupational History  . Not on file  Social Needs  . Financial resource strain: Not on file  . Food insecurity:    Worry: Not on file    Inability: Not on file  .  Transportation needs:    Medical: Not on file    Non-medical: Not on file  Tobacco Use  . Smoking status: Never Smoker  . Smokeless tobacco: Never Used  Substance and Sexual Activity  . Alcohol use: Yes    Alcohol/week: 1.2 - 2.4 oz    Types: 1 - 2 Glasses of wine, 1 - 2 Shots of liquor per week    Comment: usally specilal occasions  . Drug use: No  . Sexual activity: Yes    Birth control/protection: None  Lifestyle  . Physical activity:    Days per week: Not on file    Minutes per session: Not on file  . Stress: Not on file  Relationships  . Social connections:    Talks on phone: Not on file    Gets together: Not on file    Attends religious service: Not on file    Active member of club or organization: Not on file    Attends meetings of clubs or organizations: Not on file    Relationship status: Not on file  . Intimate partner violence:    Fear of current or ex partner: Not on file    Emotionally abused: Not on file    Physically abused: Not on file    Forced sexual activity: Not on file  Other Topics Concern  . Not on file  Social History Narrative   Entered 03/2015:   Lives with her spouse. Has no children   Works Full Time--Medical Claims at [employer]    Family History  Problem Relation Age of Onset  . Diabetes Father   . Asthma Brother   . Diabetes Maternal Grandmother   . Hypertension Maternal Grandfather   . Cancer Maternal Grandfather        prostate  . Vision loss Maternal Grandfather   . Diabetes Paternal Grandmother   . Hearing loss Neg Hx   . HIV/AIDS Mother   . Diabetes Brother   . Diabetes Brother     Physical Exam: Blood pressure 128/80, pulse 86, temperature 98.1 F (36.7 C), temperature source Oral, resp. rate 18, height 5\' 4"  (1.626 m), weight 103.6 kg (228 lb 6.4 oz), last menstrual period 04/23/2018, SpO2 99 %., Body mass index is 39.2 kg/m. General: Obese AAF. Appears in no acute distress. HEENT: Normocephalic, atraumatic. Conjunctiva  pink, sclera non-icteric. Pupils 2 mm constricting to 1 mm, round, regular, and equally reactive to light and accomodation. EOMI. Internal auditory canal clear. TMs with good cone of light and without pathology. Nasal mucosa pink. Nares are without discharge. No sinus tenderness. Oral mucosa pink. Neck: Supple. Trachea midline. No thyromegaly. Full ROM. No lymphadenopathy.No Carotid Bruits. Lungs: Clear to auscultation bilaterally without wheezes, rales, or rhonchi. Breathing is of normal effort and unlabored. Cardiovascular: RRR with S1 S2. No murmurs, rubs, or gallops. Distal pulses 2+ symmetrically. No carotid or abdominal bruits. Breast: Per Gyn Abdomen: Soft, non-tender, non-distended with normoactive bowel sounds. No hepatosplenomegaly or masses. No rebound/guarding. No CVA tenderness. No hernias.  Genitourinary: Per Gyn Musculoskeletal: Full range of motion and 5/5 strength throughout.  Skin: Warm and moist without erythema, ecchymosis, wounds, or rash. Neuro: A+Ox3. CN II-XII grossly  intact. Moves all extremities spontaneously. Full sensation throughout. Normal gait.  Psych:  Responds to questions appropriately with a normal affect.   Assessment/Plan:  32 y.o. y/o female here for CPE  1. Encounter for preventive health examination  A. Screening Labs: Says that she does not usually eat much during the day/morning so can come fasting after work tomorrow for her labs. See HPI for details - Iron; Future - CBC with Differential/Platelet; Future - COMPLETE METABOLIC PANEL WITH GFR; Future - Lipid panel; Future - TSH; Future    B. Pap: -- Per Gyn  C. Screening Mammogram: N/A D. DEXA/BMD:  N/A E. Colorectal Cancer Screening: N/A  F. Immunizations:  Influenza:  N/A Tetanus: Gave tetanus here 04/06/2015 Pneumococcal:N/A Zostavax, shingrix:N/A  2. Iron deficiency anemia, unspecified iron deficiency anemia type She also states that in the past her iron was low and she has been  taking iron supplement twice a day over-the-counter and wants to recheck that level to monitor that.  She is not sure of the dose of her iron supplement that she is taking but has been taking it twice a day. - Iron; Future - CBC with Differential/Platelet; Future    Signed, Olean Ree Sparkman, [place], [facility] 05/15/2018 3:26 PM

## 2018-05-16 ENCOUNTER — Other Ambulatory Visit: Payer: 59

## 2018-05-16 DIAGNOSIS — Z0000 Encounter for general adult medical examination without abnormal findings: Secondary | ICD-10-CM

## 2018-05-16 DIAGNOSIS — D509 Iron deficiency anemia, unspecified: Secondary | ICD-10-CM

## 2018-05-17 LAB — LIPID PANEL
Cholesterol: 166 mg/dL (ref <200)
HDL: 56 mg/dL (ref >50)
LDL Cholesterol (Calc): 100 mg/dL (calc) — ABNORMAL HIGH
Non-HDL Cholesterol (Calc): 110 mg/dL (calc) (ref <130)
Total CHOL/HDL Ratio: 3 (calc) (ref <5.0)
Triglycerides: 34 mg/dL (ref <150)

## 2018-05-17 LAB — COMPLETE METABOLIC PANEL WITH GFR
AG Ratio: 1.4 (calc) (ref 1.0–2.5)
ALT: 9 U/L (ref 6–29)
AST: 14 U/L (ref 10–30)
Albumin: 4.2 g/dL (ref 3.6–5.1)
Alkaline phosphatase (APISO): 79 U/L (ref 33–115)
BUN: 9 mg/dL (ref 7–25)
CO2: 22 mmol/L (ref 20–32)
Calcium: 9.5 mg/dL (ref 8.6–10.2)
Chloride: 106 mmol/L (ref 98–110)
Creat: 0.89 mg/dL (ref 0.50–1.10)
GFR, Est African American: 98 mL/min/{1.73_m2} (ref >=60)
GFR, Est Non African American: 85 mL/min/{1.73_m2} (ref >=60)
Globulin: 3.1 g/dL (calc) (ref 1.9–3.7)
Glucose, Bld: 76 mg/dL (ref 65–99)
Potassium: 4.1 mmol/L (ref 3.5–5.3)
Sodium: 136 mmol/L (ref 135–146)
Total Bilirubin: 0.3 mg/dL (ref 0.2–1.2)
Total Protein: 7.3 g/dL (ref 6.1–8.1)

## 2018-05-17 LAB — CBC WITH DIFFERENTIAL/PLATELET
Basophils Absolute: 38 cells/uL (ref 0–200)
Basophils Relative: 0.4 %
Eosinophils Absolute: 38 cells/uL (ref 15–500)
Eosinophils Relative: 0.4 %
HCT: 29.6 % — ABNORMAL LOW (ref 35.0–45.0)
Hemoglobin: 8.4 g/dL — ABNORMAL LOW (ref 11.7–15.5)
Lymphs Abs: 2717 cells/uL (ref 850–3900)
MCH: 18.3 pg — ABNORMAL LOW (ref 27.0–33.0)
MCHC: 28.4 g/dL — ABNORMAL LOW (ref 32.0–36.0)
MCV: 64.3 fL — ABNORMAL LOW (ref 80.0–100.0)
MPV: 9.3 fL (ref 7.5–12.5)
Monocytes Relative: 4.8 %
Neutro Abs: 6346 cells/uL (ref 1500–7800)
Neutrophils Relative %: 66.1 %
Platelets: 386 10*3/uL (ref 140–400)
RBC: 4.6 10*6/uL (ref 3.80–5.10)
RDW: 17.7 % — ABNORMAL HIGH (ref 11.0–15.0)
Total Lymphocyte: 28.3 %
WBC mixed population: 461 cells/uL (ref 200–950)
WBC: 9.6 10*3/uL (ref 3.8–10.8)

## 2018-05-17 LAB — IRON: Iron: <10 ug/dL — ABNORMAL LOW (ref 40–190)

## 2018-05-17 LAB — TSH: TSH: 1 mIU/L

## 2018-05-17 LAB — CBC MORPHOLOGY

## 2018-06-02 ENCOUNTER — Ambulatory Visit: Payer: Self-pay | Admitting: Physician Assistant

## 2018-06-11 ENCOUNTER — Encounter: Payer: Self-pay | Admitting: Physician Assistant

## 2018-06-11 ENCOUNTER — Ambulatory Visit (INDEPENDENT_AMBULATORY_CARE_PROVIDER_SITE_OTHER): Payer: 59 | Admitting: Physician Assistant

## 2018-06-11 VITALS — BP 136/84 | HR 87 | Temp 97.8°F | Resp 16 | Ht 64.0 in | Wt 230.6 lb

## 2018-06-11 DIAGNOSIS — D500 Iron deficiency anemia secondary to blood loss (chronic): Secondary | ICD-10-CM

## 2018-06-11 DIAGNOSIS — D219 Benign neoplasm of connective and other soft tissue, unspecified: Secondary | ICD-10-CM | POA: Diagnosis not present

## 2018-06-11 MED ORDER — FERROUS SULFATE 325 (65 FE) MG PO TBEC: 325.0000 mg | DELAYED_RELEASE_TABLET | Freq: Three times a day (TID) | ORAL | 11 refills | Status: DC

## 2018-06-11 NOTE — Progress Notes (Signed)
Patient ID: Katie Medina MRN: [id number], DOB: 02/03/1986, 32 y.o. Date of Encounter: 06/11/2018, 4:10 PM    Chief Complaint:  Chief Complaint  Patient presents with  . discuss lab work     HPI: 31 y.o. year old female is here for Ov to discuss recent lab results.   She had CPE visit with me 05/15/2018. Labs at that visit showed significant anemia with---- hemoglobin 8.4---- hematocrit 29.6---- MCV 64.3---- MCH 18.3.        Iron < 10   Today I reviewed these results with her.  Discussed that I wanted to talk to her about causes of her anemia. She states that when she was about 57 to 32 years old she had fibroids and was anemic at that time.  2013 had fibroids removed. States that in around 2015 her gynecologist told her that the fibroids had "reoccurred ". She reports "I have not been able to do the surgery yet ".  Asked why and she says because of finances and her deductible.  Says that she "hopes to this year"----hopes that she will be able to have the money to pay the deductible to have the surgery this year.   States that her menses come regularly but that she does bleed for 6 to 7 days with each menses.  Says that the third day is very heavy bleeding and that she is not able to leave her house and "wears diapers ". Says that she does go 2 to 3 weeks with no bleeding between menses.  Says that she has an appointment to see her gynecologist June 26.  Has had no known hematochezia or melena.      Home Meds:   Outpatient Medications Prior to Visit  Medication Sig Dispense Refill  . Multiple Vitamins-Iron (MULTIVITAMIN/IRON PO) Take 1 tablet by mouth daily.    . nitrofurantoin, macrocrystal-monohydrate, (MACROBID) 100 MG capsule Take 1 capsule (100 mg total) by mouth 2 (two) times daily. 6 capsule 0   No facility-administered medications prior to visit.     Allergies:  Allergies  Allergen Reactions  . Penicillins   . Percocet [Oxycodone-Acetaminophen] Other  (See Comments)    Causes back pain.      Review of Systems: See HPI for pertinent ROS. All other ROS negative.    Physical Exam: Blood pressure 136/84, pulse 87, temperature 97.8 F (36.6 C), temperature source Oral, resp. rate 16, height 5\' 4"  (1.626 m), weight 104.6 kg (230 lb 9.6 oz), last menstrual period 05/19/2018, SpO2 98 %., Body mass index is 39.58 kg/m. General:  AAF. Appears in no acute distress. Neck: Supple. No thyromegaly. No lymphadenopathy. Lungs: Clear bilaterally to auscultation without wheezes, rales, or rhonchi. Breathing is unlabored. Heart: Regular rhythm. No murmurs, rubs, or gallops. Msk:  Strength and tone normal for age. Extremities/Skin: Warm and dry.  Neuro: Alert and oriented X 3. Moves all extremities spontaneously. Gait is normal. CNII-XII grossly in tact. Psych:  Responds to questions appropriately with a normal affect.     ASSESSMENT AND PLAN:  32 y.o. year old female with   1. Fibroids Managed by GYN.  Has appointment there June 26.  2. Iron deficiency anemia due to chronic blood loss We will quite certain that her iron deficiency anemia is secondary to her uterine fibroids and GYN blood loss but for sake of completion will go ahead and document Heme-o-sure She has done this test then will start ferrous sulfate 325mg  3 times daily. Will have her  return for follow-up in 3 months and we will recheck labs to make sure anemia is improving. - Fecal occult blood, imunochemical - ferrous sulfate 325 (65 FE) MG EC tablet; Take 1 tablet (325 mg total) by mouth 3 (three) times daily with meals.  Dispense: 90 tablet; Refill: [address] [place], [place], [facility] 06/11/2018 4:10 PM

## 2018-06-12 ENCOUNTER — Encounter: Payer: Self-pay | Admitting: Physician Assistant

## 2018-06-12 ENCOUNTER — Telehealth: Payer: Self-pay

## 2018-06-12 NOTE — Telephone Encounter (Signed)
Nu Iron 150mg  1 po BID # 60 +11

## 2018-06-12 NOTE — Telephone Encounter (Signed)
Patient called and left a message stating her insurance will not cover Ferrous-sulfate 325mg  and states she was told to call back and inform PCP if it was not covered . Patient would like another rx sent in

## 2018-06-12 NOTE — Telephone Encounter (Signed)
Call placed to patient she states she found a discount card that she will try to use and if the cost is too much then she will start the new rx. Patient asked that I hold off on the sending in the new rx for now until she get back with me.

## 2018-06-13 MED ORDER — POLYSACCHARIDE IRON COMPLEX 150 MG PO CAPS: 150.0000 mg | ORAL_CAPSULE | Freq: Two times a day (BID) | ORAL | 11 refills | Status: DC

## 2018-06-13 NOTE — Telephone Encounter (Signed)
Patient decided to try nu iron rx has been sent in to pharmacy

## 2018-06-18 ENCOUNTER — Other Ambulatory Visit: Payer: 59

## 2018-06-18 DIAGNOSIS — D500 Iron deficiency anemia secondary to blood loss (chronic): Secondary | ICD-10-CM

## 2018-06-19 LAB — FECAL GLOBIN BY IMMUNOCHEMISTRY
FECAL GLOBIN RESULT:: NOT DETECTED
MICRO NUMBER:: 90763844
SPECIMEN QUALITY:: ADEQUATE

## 2018-07-22 ENCOUNTER — Ambulatory Visit: Payer: 59 | Admitting: Internal Medicine

## 2018-07-22 ENCOUNTER — Encounter (INDEPENDENT_AMBULATORY_CARE_PROVIDER_SITE_OTHER): Payer: Self-pay

## 2018-07-22 ENCOUNTER — Other Ambulatory Visit: Payer: Self-pay

## 2018-07-22 VITALS — BP 128/74 | HR 77 | Temp 98.4°F | Ht 64.0 in | Wt 233.4 lb

## 2018-07-22 DIAGNOSIS — E282 Polycystic ovarian syndrome: Secondary | ICD-10-CM | POA: Diagnosis not present

## 2018-07-22 DIAGNOSIS — K5901 Slow transit constipation: Secondary | ICD-10-CM

## 2018-07-22 DIAGNOSIS — F329 Major depressive disorder, single episode, unspecified: Secondary | ICD-10-CM

## 2018-07-22 DIAGNOSIS — Z79899 Other long term (current) drug therapy: Secondary | ICD-10-CM

## 2018-07-22 DIAGNOSIS — E669 Obesity, unspecified: Secondary | ICD-10-CM

## 2018-07-22 DIAGNOSIS — E785 Hyperlipidemia, unspecified: Secondary | ICD-10-CM

## 2018-07-22 DIAGNOSIS — R011 Cardiac murmur, unspecified: Secondary | ICD-10-CM

## 2018-07-22 DIAGNOSIS — Z6841 Body Mass Index (BMI) 40.0 and over, adult: Secondary | ICD-10-CM

## 2018-07-22 DIAGNOSIS — N920 Excessive and frequent menstruation with regular cycle: Secondary | ICD-10-CM

## 2018-07-22 DIAGNOSIS — D259 Leiomyoma of uterus, unspecified: Secondary | ICD-10-CM

## 2018-07-22 DIAGNOSIS — D500 Iron deficiency anemia secondary to blood loss (chronic): Secondary | ICD-10-CM

## 2018-07-22 DIAGNOSIS — K5900 Constipation, unspecified: Secondary | ICD-10-CM | POA: Insufficient documentation

## 2018-07-22 DIAGNOSIS — K219 Gastro-esophageal reflux disease without esophagitis: Secondary | ICD-10-CM

## 2018-07-22 DIAGNOSIS — Z0000 Encounter for general adult medical examination without abnormal findings: Secondary | ICD-10-CM | POA: Diagnosis not present

## 2018-07-22 DIAGNOSIS — K5909 Other constipation: Secondary | ICD-10-CM

## 2018-07-22 DIAGNOSIS — L732 Hidradenitis suppurativa: Secondary | ICD-10-CM

## 2018-07-22 LAB — POCT GLYCOSYLATED HEMOGLOBIN (HGB A1C): Hemoglobin A1C: 5.6 % (ref 4.0–5.6)

## 2018-07-22 LAB — GLUCOSE, CAPILLARY: Glucose-Capillary: 90 mg/dL (ref 70–99)

## 2018-07-22 NOTE — Assessment & Plan Note (Signed)
Assessment:  Pt with long history of "weight issues" and "borderline diabetes." Reports diagnosis of PCOS and recent screening with elevated cholesterol. Diet not ideal and does not exercise often. No history of thyroid disease per patient and most recent TSH 04/2018 nml.      Plan: Discussed diet, exercise and weight loss today. Approximate ASCVD risk <5%, will not start statin today. Could consider addition of Metformin at follow-up if no improvement in weight.

## 2018-07-22 NOTE — Assessment & Plan Note (Signed)
Assessment:  Pt reports long history (>5 yrs) of constipation. Used to take miralax daily but feels she is no longer responding. Currently using prn OTC suppositories and prn OTC stool softeners with good effect. Also finds good relief with the "Squatty Potty." Denies any weight loss, melena or hematochezia. Admits to diet low in fiber.      Plan: Discussed dietary modifications including increased fiber and water intake. Encouraged increased exercise. Pt can use prn suppository and prn stool softener as needed as directed on bottle. Pt to contact clinic if symptoms change.

## 2018-07-22 NOTE — Assessment & Plan Note (Addendum)
Assessment:  Pt reports history of PCOS which was diagnosed after seeing a dermatologist for darkened skin behind her neck. She's been told she's 'borderline' diabetic and was previously on Metformin but has not been on this medication in some time. Lipid panel recently checked with total cholesterol 166, LDL 100 and HDL 56 with estimated 10-year ASCVD risk <5%. She has no family history of MI.      Plan: Will check A1c today. Asked patient to discuss diagnosis and possible Metformin with her gynecologist at her upcoming appointment. Until then encouraged diet, exercise and weight loss.

## 2018-07-22 NOTE — Patient Instructions (Signed)
It was nice seeing you today. Thank you for choosing [facility] for your Primary Care.   Today we talked about:  1) GERD: Your symptoms of reflux are most consistent with GERD. I'm glad you are getting some relief with the ginger tea's and the over-the-counter medication. You can try:  -Zantac (or generic) 1 tablet before meals known to worsen heartburn OR at the first sign of heartburn -Prilosec (or generic) 1 tablet daily as a preventative measure  2) Constipation: Please use the suppository and stool softener as recommended. It's always important to ensure you are getting enough water and fiber in your diet as well.  -Please use the suppository as needed, up to once a day -Please use the stool softener daily, up to twice daily  3)  Anemia: Please continue iron supplements as prescribed by your gynecologist. I'm checking your hemoglobin levels today.   4) Depression: Please call the number on the back of your insurance card to find providers in your area that are covered. There is also a website called "psychologytoday" where you can search for providers in your area (although you may have to pay out of pocket)  5) Weight loss: Please keep in mind what we discussed today about diet, activities.    FOLLOW-UP INSTRUCTIONS When: 2-3 months For: GERD, constipation What to bring: medications  Please contact the clinic if you have any problems, or need to be seen sooner.

## 2018-07-22 NOTE — Assessment & Plan Note (Signed)
HPI: Pt reports history of anemia which she thinks is due to iron deficiency anemia from chronic blood loss related to uterine fibroids. Had myomectomy in 2013 but reports fibroid  returned and is quite large. She continues to have heavy menses. Planning on surgical intervention later this year but has no children and is still very interested in having them. She takes PO iron twice daily. Most recent iron <10.   Assessment & Plan:  Patient with faint systolic murmur which I suspect is related to anemia. She denies any syncope or fatigue worse than baseline. Will check Hb, most recent Hb 8.4 04/2018. If murmur still present at follow-up, consider ECHO.

## 2018-07-22 NOTE — Assessment & Plan Note (Signed)
Patient follows with OB/GYN, last PAP seems to be from 09/2015 from [facility]. She has an appointment with GYN next month, encouraged to enquire about repeat. No HIV screen in chart, patient with mother deceased from complications of HIV. -HIV screen ordered today

## 2018-07-22 NOTE — Assessment & Plan Note (Addendum)
HPI: Patient reports long history of GERD (>5 years) w/ acute worsening over the past few weeks. Symptoms include sour taste in mouth, cough at night/in the morning, sore throat in morning and heartburn. Notes worsened symptoms after eating certain meals (tomato sauce, spicy food, fried foods). Symptoms resolve with ginger tea and/or prn Zantac. She denies any weight loss, abdominal pain or hematemesis. She doesn't think she's had an EGD/colo and denied family history of gastric or colorectal cancer.   Assessment & Plan: Pt here with symptomatic reflux. She identifies triggers and exacerbating factors easily. Discussed lifestyle and dietary modifications to help reduce symptoms. Offered Rx for daily PPI but patient prefers to continue daily ginger tea's and prn Zantac. Can add daily PPI at follow-up if uncontrolled and consideration of EGD if any red-flags.

## 2018-07-22 NOTE — Assessment & Plan Note (Addendum)
Pt reported, no active lesions. Usually gets lesions under breasts but well-controlled recently. Uses bentonite clay when she does have a flare.

## 2018-07-22 NOTE — Progress Notes (Signed)
   CC: Establish care, GERD  HPI:  Katie Medina is a very-pleasant 32 y.o. F with chronic GERD, chronic iron deficiency anemia related to uterine fibroid, PCOS, obesity, HLD and chronic constipation who presents today to establish care. We also discussed acute GERD exacerbation.   For details regarding today's visit and the status of their chronic medical issues, please refer to the assessment and plan.  Past Medical History:  Diagnosis Date  . Anemia    otc iron supplement  . AV block, 1st degree   . Fibroid   . GERD (gastroesophageal reflux disease)    otc reflux reducer prn-has not used in last month-assoc with foods  . 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 Denies acute changes in vision, dysphagia Cardiac: Denies CP, SOB, palpitations Pulmonary: Denies wheezes, PND, orthopnea Abd: Admits to constipation. Denies abdominal pain, diarrhea, hematochezia, melena, painful defecation Extremities: Denies weakness or swelling  Physical Exam: General: Alert, in no acute distress. Pleasant and conversant HEENT: No icterus, injection or ptosis. No hoarseness or dysarthria  Cardiac: RRR. Faint systolic murmur RUSB Pulmonary: CTA BL with normal WOB on RA. Able to speak in complete  sentences Abd: Soft, non-tender. +bs Extremities: Warm, perfused. No significant pedal edema.  Skin:+Acanthosis nigricans on posterior neck Neuro: Facial muscles symmetric. EOMI. Normal gait, walks without assistance. Strength and sensation intact BL UE and LE.   Vitals:   07/22/18 0935  BP: 128/74  Pulse: 77  Temp: 98.4 F (36.9 C)  TempSrc: Oral  SpO2: 100%  Weight: 233 lb 6.4 oz (105.9 kg)  Height: 5\' 4"  (1.626 m)   Body mass index is 40.06 kg/m.  Assessment & Plan:   See Encounters Tab for problem based charting.  Patient discussed with Dr. Daryll Drown

## 2018-07-23 LAB — CBC
Hematocrit: 31.3 % — ABNORMAL LOW (ref 34.0–46.6)
Hemoglobin: 8.9 g/dL — ABNORMAL LOW (ref 11.1–15.9)
MCH: 18.8 pg — ABNORMAL LOW (ref 26.6–33.0)
MCHC: 28.4 g/dL — ABNORMAL LOW (ref 31.5–35.7)
MCV: 66 fL — ABNORMAL LOW (ref 79–97)
Platelets: 456 10*3/uL — ABNORMAL HIGH (ref 150–450)
RBC: 4.73 x10E6/uL (ref 3.77–5.28)
RDW: 19.3 % — ABNORMAL HIGH (ref 12.3–15.4)
WBC: 8.2 10*3/uL (ref 3.4–10.8)

## 2018-07-23 LAB — HIV ANTIBODY (ROUTINE TESTING W REFLEX): HIV Screen 4th Generation wRfx: NONREACTIVE

## 2018-07-27 NOTE — Progress Notes (Signed)
[facility] Medicine Clinic Attending  Case discussed with Dr. Molt at the time of the visit.  We reviewed the resident's history and exam and pertinent patient test results.  I agree with the assessment, diagnosis, and plan of care documented in the resident's note. 

## 2018-07-27 NOTE — Addendum Note (Signed)
Addended by: Gilles Chiquito B on: 07/27/2018 09:30 AM   Modules accepted: Level of Service

## 2018-08-14 ENCOUNTER — Encounter: Payer: Self-pay | Admitting: *Deleted

## 2018-09-12 DIAGNOSIS — H9202 Otalgia, left ear: Secondary | ICD-10-CM | POA: Diagnosis not present

## 2018-09-12 DIAGNOSIS — J343 Hypertrophy of nasal turbinates: Secondary | ICD-10-CM | POA: Diagnosis not present

## 2018-09-12 DIAGNOSIS — H6122 Impacted cerumen, left ear: Secondary | ICD-10-CM | POA: Diagnosis not present

## 2018-09-12 DIAGNOSIS — H9311 Tinnitus, right ear: Secondary | ICD-10-CM | POA: Diagnosis not present

## 2018-10-06 ENCOUNTER — Encounter: Payer: 59 | Admitting: Internal Medicine

## 2018-10-23 ENCOUNTER — Encounter: Payer: 59 | Admitting: Internal Medicine

## 2018-10-28 ENCOUNTER — Telehealth: Payer: Self-pay | Admitting: Internal Medicine

## 2018-10-28 NOTE — Telephone Encounter (Signed)
Rtc, lm for rtc 

## 2018-10-28 NOTE — Telephone Encounter (Signed)
Pt would like a call back about her head pain.  Offered patient an appt but, she is unable to come for 2 weeks.  Please call patient back.

## 2018-10-29 ENCOUNTER — Telehealth: Payer: Self-pay

## 2018-10-29 NOTE — Telephone Encounter (Signed)
Called pt back again, spoke to her, encouraged her to try to come to clinic sooner than later but she declines due to work, she states h/a going on and off for appr 2 months, always L side of head, centers at temple area, denies N&V, vision changes, weakness, change in speech, change in face. appt [facility] 11/26 at her request. Ask that if she develops any of above she call 911 also if she finds she can come earlier to clinic to please call and reschedule to earlier date, she is agreeable

## 2018-10-29 NOTE — Telephone Encounter (Signed)
Requesting to speak with a nurse about pain on the head.

## 2018-10-29 NOTE — Telephone Encounter (Signed)
This is addressed in 11/5 note

## 2018-11-18 ENCOUNTER — Ambulatory Visit: Payer: 59

## 2019-01-06 ENCOUNTER — Encounter: Payer: Self-pay | Admitting: Internal Medicine

## 2019-04-14 ENCOUNTER — Other Ambulatory Visit: Payer: Self-pay

## 2019-04-14 ENCOUNTER — Encounter: Payer: Self-pay | Admitting: Internal Medicine

## 2019-04-14 ENCOUNTER — Ambulatory Visit (INDEPENDENT_AMBULATORY_CARE_PROVIDER_SITE_OTHER): Payer: 59 | Admitting: Internal Medicine

## 2019-04-14 DIAGNOSIS — J3489 Other specified disorders of nose and nasal sinuses: Secondary | ICD-10-CM | POA: Diagnosis not present

## 2019-04-14 DIAGNOSIS — R51 Headache: Secondary | ICD-10-CM | POA: Diagnosis not present

## 2019-04-14 DIAGNOSIS — R519 Headache, unspecified: Secondary | ICD-10-CM

## 2019-04-14 DIAGNOSIS — G8929 Other chronic pain: Secondary | ICD-10-CM | POA: Insufficient documentation

## 2019-04-14 MED ORDER — CETIRIZINE HCL 10 MG PO CHEW: 10.0000 mg | CHEWABLE_TABLET | Freq: Every day | ORAL | 0 refills | Status: DC

## 2019-04-14 MED ORDER — FLUTICASONE PROPIONATE 50 MCG/ACT NA SUSP: 1.0000 | Freq: Every day | NASAL | 0 refills | Status: DC

## 2019-04-14 MED ORDER — NAPROXEN 375 MG PO TABS: 375.0000 mg | ORAL_TABLET | Freq: Two times a day (BID) | ORAL | 0 refills | Status: DC

## 2019-04-14 NOTE — Progress Notes (Signed)
   [facility] Telephone Encounter  Reason for call:   This telephone encounter was created for Ms. Katie Medina on 04/14/2019 for the following purpose/cc headaches.   Pertinent Data:   Anemic, PCOS, hidradenitis ROS: Pulmonary: pt denies increased work of breathing, shortness of breath,  Cardiac: pt denies palpitations, chest pain,   Abdominal: pt denies abdominal pain, nausea, vomiting, or diarrhea   Assessment / Plan / Recommendations:   Headaches: intermittent since last year but started increasing in frequency over the last two months.  Has been switching to contact lenses from glasses.  Has had trouble finding a brand that will fit correctly and therefore on some days does not wear glasses or have contacts.  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  She has been doing this for 6 months.  Headaches seemed to increase when starting this job.  Headache pattern has changed and become more frequently but seems to be related to increased computer time and not wearing her glasses.  No fevers or chills.  Blood pressure has been normal.  She is not on oral contraceptive pills.  Still anemic and heavy periods occurring every 30 days.  Has been taking iron for about one year.  No headaches around menstruation.  Has been taking otc tylenol 500mg  tabs with little relief.  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  Headaches last about 12 hours.  She has not had a headache since Saturday or Sunday.  She describes it as more of a pressure may be sinus pressure.  No neurological deficits.  Not pregnant LNMP was April 5-9.    Overall headache history is reassuring no real red flag symptoms  Trial of Naproxen, flonase, zyrtec, blue light blocking sunglasses  Office visit for CBC, iron studies   As always, pt is advised that if symptoms  worsen or new symptoms arise, they should go to an urgent care facility or to to ER for further evaluation.   Consent and Medical Decision Making:   Patient discussed with Dr. Beryle Beams  This is a telephone encounter between Katie Medina and Katie Medina on 04/14/2019 for headaches. The visit was conducted with the patient located at home and Katie Medina at [facility]. The patient's identity was confirmed using their DOB and current address. The patient has consented to being evaluated through a telephone encounter and understands the associated risks (an examination cannot be done and the patient may need to come in for an appointment) / benefits (allows the patient to remain at home, decreasing exposure to coronavirus). I personally spent 22 minutes on medical discussion.

## 2019-04-14 NOTE — Assessment & Plan Note (Signed)
   Headaches: intermittent since last year but started increasing in frequency over the last two months.  Has been switching to contact lenses from glasses.  Has had trouble finding a brand that will fit correctly and therefore on some days does not wear glasses or have contacts.  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  She has been doing this for 6 months.  Headaches seemed to increase when starting this job.  Headache pattern has changed and become more frequently but seems to be related to increased computer time and not wearing her glasses.  No fevers or chills.  Blood pressure has been normal.  She is not on oral contraceptive pills.  Still anemic and heavy periods occurring every 30 days.  Has been taking iron for about one year.  No headaches around menstruation.  Has been taking otc tylenol 500mg  tabs with little relief.  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  Headaches last about 12 hours.  She has not had a headache since Saturday or Sunday.  She describes it as more of a pressure may be sinus pressure.  No neurological deficits.  Not pregnant LNMP was April 5-9.    Overall headache history is reassuring no real red flag symptoms  Trial of Naproxen, flonase, zyrtec, blue light blocking sunglasses  Office visit for CBC, iron studies   As always, pt is advised that if symptoms worsen or new symptoms arise, they should go to an urgent care facility or to to ER for further evaluation.

## 2019-04-14 NOTE — Progress Notes (Signed)
Medicine attending: Medical history, presenting problems,  and medications, reviewed with resident physician Dr Guadlupe Spanish on the day of the patient telephone consultation and I concur with his evaluation and management plan. Increased frequency but not severity of migratory headaches occurring over the last year. Work related component and sinus issues. No neuro red flags. Rx plan per Dr Shan Levans.

## 2019-04-14 NOTE — Assessment & Plan Note (Signed)
   Sinus pressure:  She describes her headaches may have a component of seasonality and she does feel sinus pressure and has rhinorrhea and watery eyes.   flonase, zyrtec

## 2019-04-14 NOTE — Telephone Encounter (Signed)
Pt states the doctor supposed to called in meds at the pharmacy, pt states the pharmacy do not have any meds for her. Please call pt back.

## 2019-04-14 NOTE — Telephone Encounter (Signed)
Called [facility], they have scripts. Called pt, informed her, she was agreeable

## 2019-05-11 ENCOUNTER — Telehealth: Payer: Self-pay | Admitting: Internal Medicine

## 2019-05-11 NOTE — Telephone Encounter (Signed)
Pt would like a call back in reference to some abnormal labs from [facility] she rec'd.  Patient is to have labs faxed from their office.  Pt is on her way to work and would like a call back anytime tomorrow if possible.

## 2019-12-08 ENCOUNTER — Ambulatory Visit: Payer: 59

## 2020-04-28 ENCOUNTER — Encounter: Payer: Self-pay | Admitting: *Deleted

## 2020-06-08 ENCOUNTER — Other Ambulatory Visit: Payer: Self-pay | Admitting: Obstetrics and Gynecology

## 2020-08-02 ENCOUNTER — Encounter: Payer: 59 | Admitting: Student

## 2020-10-11 ENCOUNTER — Ambulatory Visit: Payer: Self-pay

## 2020-10-11 ENCOUNTER — Ambulatory Visit
Admission: EM | Admit: 2020-10-11 | Discharge: 2020-10-11 | Disposition: A | Discharge status: Home / Self Care | Payer: 59 | Attending: Emergency Medicine | Admitting: Emergency Medicine

## 2020-10-11 ENCOUNTER — Other Ambulatory Visit: Payer: Self-pay

## 2020-10-11 DIAGNOSIS — H6592 Unspecified nonsuppurative otitis media, left ear: Secondary | ICD-10-CM

## 2020-10-11 DIAGNOSIS — H60392 Other infective otitis externa, left ear: Secondary | ICD-10-CM

## 2020-10-11 MED ORDER — FLUTICASONE PROPIONATE 50 MCG/ACT NA SUSP: 1.0000 | Freq: Every day | NASAL | 0 refills | Status: AC

## 2020-10-11 MED ORDER — NEOMYCIN-POLYMYXIN-HC 3.5-10000-1 OT SUSP: 4.0000 [drp] | Freq: Three times a day (TID) | OTIC | 0 refills | Status: DC

## 2020-10-11 NOTE — ED Triage Notes (Signed)
Pt presents with c/o left side ear pain that began about a week ago

## 2020-10-11 NOTE — Discharge Instructions (Signed)
Rest and drink plenty of fluids Prescribed Cortisporin Prescribed Flonase for middle ear effusion Take medications as directed and to completion Continue to use OTC ibuprofen and/ or tylenol as needed for pain control Follow up with PCP if symptoms persists Return here or go to the ER if you have any new or worsening symptoms

## 2020-10-11 NOTE — ED Provider Notes (Signed)
[facility]   [id number] 10/11/20 Arrival Time: 2725  Chief Complaint  Patient presents with  . Otalgia     SUBJECTIVE: History from: patient.  Katie Medina is a 34 y.o. female who presented to the urgent care with a complaint of left ear pain that started 1 week ago.  Denies a precipitating event, such as swimming or wearing ear plugs.  Patient states the pain is constant and achy in character.  Patient has tried OTC medication without relief.  Symptoms are made worse with lying down.  Denies similar symptoms in the past.    Denies fever, chills, fatigue, sinus pain, rhinorrhea, ear discharge, sore throat, SOB, wheezing, chest pain, nausea, changes in bowel or bladder habits.    ROS: As per HPI.  All other pertinent ROS negative.     Past Medical History:  Diagnosis Date  . Anemia    otc iron supplement  . AV block, 1st degree   . Fibroid   . GERD (gastroesophageal reflux disease)    otc reflux reducer prn-has not used in last month-assoc with foods  . Infection    UTI   Past Surgical History:  Procedure Laterality Date  . LAPAROTOMY  07/17/2012   Procedure: EXPLORATORY LAPAROTOMY;  Surgeon: Avel Sensor, MD;  Location: [facility];  Service: Gynecology;  Laterality: N/A;  . MOUTH SURGERY    . MYOMECTOMY  08/2012   Allergies  Allergen Reactions  . Penicillins   . Percocet [Oxycodone-Acetaminophen] Other (See Comments)    Causes back pain.   No current facility-administered medications on file prior to encounter.   Current Outpatient Medications on File Prior to Encounter  Medication Sig Dispense Refill  . cetirizine (ZYRTEC) 10 MG chewable tablet Chew 1 tablet (10 mg total) by mouth daily. 30 tablet 0  . iron polysaccharides (NU-IRON) 150 MG capsule Take 1 capsule (150 mg total) by mouth 2 (two) times daily. 60 capsule 11  . Multiple Vitamins-Iron (MULTIVITAMIN/IRON PO) Take 1 tablet by mouth daily.    . naproxen (NAPROSYN) 375 MG tablet Take 1 tablet  (375 mg total) by mouth 2 (two) times daily with a meal. Use as needed for headache 28 tablet 0   Social History   Socioeconomic History  . Marital status: Married    Spouse name: Not on file  . Number of children: Not on file  . Years of education: Not on file  . Highest education level: Not on file  Occupational History  . Not on file  Tobacco Use  . Smoking status: Never Smoker  . Smokeless tobacco: Never Used  Substance and Sexual Activity  . Alcohol use: Yes    Alcohol/week: 2.0 - 4.0 standard drinks    Types: 1 - 2 Glasses of wine, 1 - 2 Shots of liquor per week    Comment: usally specilal occasions  . Drug use: No  . Sexual activity: Yes    Birth control/protection: None  Other Topics Concern  . Not on file  Social History Narrative   Entered 03/2015:   Lives with her spouse. Has no children   Works Full Time--[employer] at [employer] Strain:   . Difficulty of Paying Living Expenses: Not on file  Food Insecurity:   . Worried About Charity fundraiser in the Last Year: Not on file  . Ran Out of Food in the Last Year: Not on file  Transportation Needs:   .  Lack of Transportation (Medical): Not on file  . Lack of Transportation (Non-Medical): Not on file  Physical Activity:   . Days of Exercise per Week: Not on file  . Minutes of Exercise per Session: Not on file  Stress:   . Feeling of Stress : Not on file  Social Connections:   . Frequency of Communication with Friends and Family: Not on file  . Frequency of Social Gatherings with Friends and Family: Not on file  . Attends Religious Services: Not on file  . Active Member of Clubs or Organizations: Not on file  . Attends Archivist Meetings: Not on file  . Marital Status: Not on file  Intimate Partner Violence:   . Fear of Current or Ex-Partner: Not on file  . Emotionally Abused: Not on file  . Physically Abused: Not on file  . Sexually  Abused: Not on file   Family History  Problem Relation Age of Onset  . Diabetes Father   . Asthma Brother   . Diabetes Maternal Grandmother   . Hypertension Maternal Grandfather   . Cancer Maternal Grandfather        prostate  . Vision loss Maternal Grandfather   . HIV/AIDS Mother   . Diabetes Brother   . Diabetes Brother   . Diabetes Paternal Grandmother   . Hearing loss Neg Hx     OBJECTIVE:  Vitals:   10/11/20 1550  BP: 137/84  Pulse: 88  Resp: 18  Temp: 98.3 F (36.8 C)  SpO2: 99%     Physical Exam Vitals and nursing note reviewed.  Constitutional:      General: She is not in acute distress.    Appearance: Normal appearance. She is normal weight. She is not ill-appearing, toxic-appearing or diaphoretic.  HENT:     Head: Normocephalic.     Right Ear: Tympanic membrane, ear canal and external ear normal. There is no impacted cerumen.     Left Ear: Swelling and tenderness present. A middle ear effusion is present.  Cardiovascular:     Rate and Rhythm: Normal rate and regular rhythm.     Pulses: Normal pulses.     Heart sounds: Normal heart sounds. No murmur heard.  No friction rub. No gallop.   Pulmonary:     Effort: Pulmonary effort is normal. No respiratory distress.     Breath sounds: Normal breath sounds. No stridor. No wheezing, rhonchi or rales.  Chest:     Chest wall: No tenderness.  Neurological:     Mental Status: She is alert and oriented to person, place, and time.      Imaging: No results found.   ASSESSMENT & PLAN:  1. Other infective acute otitis externa of left ear   2. Middle ear effusion, left     Meds ordered this encounter  Medications  . neomycin-polymyxin-hydrocortisone (CORTISPORIN) 3.5-10000-1 OTIC suspension    Sig: Place 4 drops into the left ear 3 (three) times daily.    Dispense:  10 mL    Refill:  0  . fluticasone (FLONASE) 50 MCG/ACT nasal spray    Sig: Place 1 spray into both nostrils daily for 14 days.     Dispense:  16 g    Refill:  0   Discharge instructions  Rest and drink plenty of fluids Prescribed Cortisporin Prescribed Flonase for middle ear effusion Take medications as directed and to completion Continue to use OTC ibuprofen and/ or tylenol as needed for pain control Follow up with PCP  if symptoms persists Return here or go to the ER if you have any new or worsening symptoms   Reviewed expectations re: course of current medical issues. Questions answered. Outlined signs and symptoms indicating need for more acute intervention. Patient verbalized understanding. After Visit Summary given.         Emerson Monte, [place] 10/11/20 [phone]

## 2020-11-03 ENCOUNTER — Ambulatory Visit (INDEPENDENT_AMBULATORY_CARE_PROVIDER_SITE_OTHER): Payer: 59 | Admitting: Internal Medicine

## 2020-11-03 VITALS — BP 151/83 | HR 101 | Temp 98.2°F | Ht 64.0 in | Wt 235.0 lb

## 2020-11-03 DIAGNOSIS — R519 Headache, unspecified: Secondary | ICD-10-CM | POA: Diagnosis not present

## 2020-11-03 DIAGNOSIS — R030 Elevated blood-pressure reading, without diagnosis of hypertension: Secondary | ICD-10-CM | POA: Diagnosis not present

## 2020-11-03 DIAGNOSIS — F418 Other specified anxiety disorders: Secondary | ICD-10-CM | POA: Diagnosis not present

## 2020-11-03 DIAGNOSIS — Z6841 Body Mass Index (BMI) 40.0 and over, adult: Secondary | ICD-10-CM

## 2020-11-03 DIAGNOSIS — G8929 Other chronic pain: Secondary | ICD-10-CM

## 2020-11-03 NOTE — Patient Instructions (Addendum)
It was nice seeing you today! Thank you for choosing [facility] for your Primary Care.    Today we talked about:   1. Headaches:  a. Start keeping a log on your headaches, their location and triggers you may have identified.  b. Continue taking Tylenol as needed for relief.  c. Make sure to drink plenty of water and do not skip meals.

## 2020-11-03 NOTE — Progress Notes (Signed)
   CC: headache  HPI:  [name] is a 34 y.o. with a PMHx as listed below who presents to the clinic for headache.   Please see the Encounters tab for problem-based Assessment & Plan regarding status of patient's acute and chronic conditions.  Past Medical History:  Diagnosis Date  . Anemia    otc iron supplement  . AV block, 1st degree   . Fibroid   . GERD (gastroesophageal reflux disease)    otc reflux reducer prn-has not used in last month-assoc with foods  . Infection    UTI   Review of Systems: Review of Systems  Constitutional: Negative for chills, fever, malaise/fatigue and weight loss.  HENT: Positive for tinnitus (chronic). Negative for congestion and sinus pain.   Eyes: Negative for blurred vision, double vision, photophobia and discharge.  Neurological: Positive for headaches. Negative for dizziness, sensory change, focal weakness and weakness.  Psychiatric/Behavioral: Positive for depression and suicidal ideas. Negative for hallucinations, memory loss and substance abuse. The patient is nervous/anxious. The patient does not have insomnia.    Physical Exam:  Vitals:   11/03/20 1509  BP: (!) 151/83  Pulse: (!) 101  Temp: 98.2 F (36.8 C)  TempSrc: Oral  Weight: 235 lb (106.6 kg)  Height: 5\' 4"  (1.626 m)    Physical Exam Vitals and nursing note reviewed.  Constitutional:      General: She is not in acute distress. HENT:     Head: Normocephalic and atraumatic.     Nose:     Right Sinus: No maxillary sinus tenderness or frontal sinus tenderness.     Left Sinus: No maxillary sinus tenderness or frontal sinus tenderness.  Eyes:     Extraocular Movements: Extraocular movements intact.  Skin:    General: Skin is warm and dry.  Neurological:     Mental Status: She is alert and oriented to person, place, and time. Mental status is at baseline.     Cranial Nerves: No dysarthria or facial asymmetry.     Gait: Gait normal.  Psychiatric:        Mood  and Affect: Mood normal. Mood is not anxious or depressed.        Speech: Speech normal.        Behavior: Behavior normal. Behavior is not agitated.        Cognition and Memory: Cognition is not impaired. Memory is not impaired.    Assessment & Plan:   See Encounters Tab for problem based charting.  Patient discussed with Dr. Jimmye Norman

## 2020-11-04 NOTE — Progress Notes (Signed)
Internal Medicine Clinic Attending  Case discussed with Dr. Basaraba  At the time of the visit.  We reviewed the resident's history and exam and pertinent patient test results.  I agree with the assessment, diagnosis, and plan of care documented in the resident's note.  

## 2020-11-04 NOTE — Assessment & Plan Note (Signed)
Katie Medina states she has an extensive family history of elevated blood pressure and is concerned about her blood pressure reading today.  She denies any previous history of hypertension.  Assessment/plan: Elevated blood pressure today may be in relation to patient's acute distress.  We will have her follow-up in 2 weeks for recheck and further discussions.

## 2020-11-04 NOTE — Assessment & Plan Note (Signed)
Katie Medina states she has been experiencing increased anxiety and depression and would like to follow-up with this.  She states that she feels like she is having a midlife crisis and often ponders on whether she has been successful enough.  This has caused some decrease in quality of life.  She endorses occasional fleeting thoughts of being better off dead, but denies any SI or HI.  She denies any intent or plan to harm her self.  She states that in the past she did see a therapist back in 2016, but has not followed up.  She would like to see a therapist again and would hope to do so with her husband so they can learn to better communicate.  Assessment/plan: No indication at this time for emergent hospitalization.  I did emphasize that I would like to have a visit with her with more time to discuss her anxiety and depression as this was mentioned last minute in the visit.  Patient is in agreement to follow-up in 2 weeks.  -2-week follow-up -Referral to [facility]

## 2020-11-04 NOTE — Assessment & Plan Note (Signed)
Katie Medina states that she would like to start working on her weight and is interested in a referral to a dietitian.  Assessment/plan: -Referral to [facility] placed

## 2020-11-04 NOTE — Assessment & Plan Note (Signed)
Katie Medina states that she has a long-term history of headaches that date back to her adolescent age, however her headaches have always been usually on the top of her head wrapping around.  She states that in past several months, her headaches have now been predominantly in her bitemporal region, although occasionally it is just 1 side.  The sides of headache tend to switch.  The pain is described as sharp and will last hours.  Last week, she tried Tylenol sinus when she had a headache onset and this helped significantly so.  Previously, she states the only thing that would relieve her headache is sleep.  She denies any vision changes, dizziness, eye watering, focal weakness.  She denies any preceding nausea, vomiting, photophobia or aura.  Currently she states she is not having headache at this time.  Assessment/plan: The characteristics of patient's headache is inconsistent with a tension type headache being that it is only in the bitemporal region.  It is slightly suspicious for migraine given that sometimes is just 1 side and not bilateral.  The location of patient's pain is not consistent with a sinus headache, however her symptoms were completely relieved by Tylenol sinus, which in addition to Tylenol has phenylephrine and guaifenesin.   At this time given that her headache has resolved, no further intervention indicated at this time.  I recommended that patient obtain a headache journal and keep a log, and to follow-up should her headaches recur.

## 2020-11-07 ENCOUNTER — Encounter: Payer: 59 | Admitting: Internal Medicine

## 2020-11-16 ENCOUNTER — Institutional Professional Consult (permissible substitution): Payer: 59 | Admitting: Behavioral Health

## 2020-11-21 ENCOUNTER — Telehealth: Payer: Self-pay | Admitting: Dietician

## 2020-11-21 NOTE — Telephone Encounter (Signed)
Called and spoke to patient. Instructed her on how to call her insurance company to find out if MNT is a covered benefit for weight loss/obesity. She verbalized understanding and wanted [place] to wait for her to get back in touch with [place] before setting up and appointment.

## 2020-11-23 NOTE — Telephone Encounter (Signed)
Called patient again and explained that our office had called her insurance plan and that a visit with this Probation officer is covered. She verbalized understanding saying she would call to schedule an appointment when she gets back form vacation.

## 2020-11-25 ENCOUNTER — Encounter: Payer: Self-pay | Admitting: Obstetrics and Gynecology

## 2020-12-01 ENCOUNTER — Encounter: Payer: 59 | Admitting: Dietician

## 2020-12-01 ENCOUNTER — Ambulatory Visit: Payer: 59 | Admitting: Dietician

## 2020-12-01 ENCOUNTER — Encounter: Payer: Self-pay | Admitting: Obstetrics and Gynecology

## 2020-12-06 ENCOUNTER — Other Ambulatory Visit: Payer: Self-pay

## 2020-12-06 ENCOUNTER — Encounter: Payer: Self-pay | Admitting: Obstetrics and Gynecology

## 2020-12-06 ENCOUNTER — Encounter: Payer: Self-pay | Admitting: *Deleted

## 2020-12-06 ENCOUNTER — Ambulatory Visit: Payer: 59 | Admitting: Obstetrics and Gynecology

## 2020-12-06 VITALS — BP 116/76 | HR 94 | Ht 64.0 in | Wt 237.4 lb

## 2020-12-06 DIAGNOSIS — D219 Benign neoplasm of connective and other soft tissue, unspecified: Secondary | ICD-10-CM | POA: Diagnosis not present

## 2020-12-06 DIAGNOSIS — N979 Female infertility, unspecified: Secondary | ICD-10-CM | POA: Diagnosis not present

## 2020-12-06 DIAGNOSIS — Z1231 Encounter for screening mammogram for malignant neoplasm of breast: Secondary | ICD-10-CM | POA: Diagnosis not present

## 2020-12-06 NOTE — Progress Notes (Signed)
HPI:      Ms. Katie Medina is a 34 y.o. G0P0 who LMP was Patient's last menstrual period was 11/10/2020.  Subjective:   She presents today for consultation regarding large uterine fibroids.  Patient desires pregnancy. Her history is significant and that 8 years ago she underwent laparotomy for removal of a large uterine fibroid.  She had "too much" bleeding and her surgery was interrupted.  6 weeks later she underwent robotic excision of uterine fibroids.  She states that her uterine fibroids have returned and that she now desires immediate fertility. She reports that she has been attempting pregnancy over the last few years without success.  She reports that her partner has never fathered a child.  She has never been pregnant.  She does have normal regular menstrual periods monthly.  She is considering myomectomy again if this is a possibility for enhancing her fertility. Patient states that more than 2 years ago she had an extensive work-up for a breast problem which turned out to be "lymphedema".  She is experiencing no issues with her breast but would like a follow-up mammography performed.    Hx: The following portions of the patient's history were reviewed and updated as appropriate:             She  has a past medical history of Anemia, AV block, 1st degree, Fibroid, GERD (gastroesophageal reflux disease), and Infection. She does not have any pertinent problems on file. She  has a past surgical history that includes Mouth surgery; laparotomy (07/17/2012); and Myomectomy (08/2012). Her family history includes Asthma in her brother; Cancer in her maternal grandfather; Diabetes in her brother, brother, father, maternal grandmother, and paternal grandmother; HIV/AIDS in her mother; Hypertension in her maternal grandfather; Vision loss in her maternal grandfather. She  reports that she has never smoked. She has never used smokeless tobacco. She reports current alcohol use of about 2.0 - 4.0  standard drinks of alcohol per week. She reports that she does not use drugs. She has a current medication list which includes the following prescription(s): cetirizine, iron polysaccharides, multiple vitamins-iron, and fluticasone. She is allergic to penicillins and percocet [oxycodone-acetaminophen].       Review of Systems:  Review of Systems  Constitutional: Denied constitutional symptoms, night sweats, recent illness, fatigue, fever, insomnia and weight loss.  Eyes: Denied eye symptoms, eye pain, photophobia, vision change and visual disturbance.  Ears/Nose/Throat/Neck: Denied ear, nose, throat or neck symptoms, hearing loss, nasal discharge, sinus congestion and sore throat.  Cardiovascular: Denied cardiovascular symptoms, arrhythmia, chest pain/pressure, edema, exercise intolerance, orthopnea and palpitations.  Respiratory: Denied pulmonary symptoms, asthma, pleuritic pain, productive sputum, cough, dyspnea and wheezing.  Gastrointestinal: Denied, gastro-esophageal reflux, melena, nausea and vomiting.  Genitourinary: See HPI for additional information.  Musculoskeletal: Denied musculoskeletal symptoms, stiffness, swelling, muscle weakness and myalgia.  Dermatologic: Denied dermatology symptoms, rash and scar.  Neurologic: Denied neurology symptoms, dizziness, headache, neck pain and syncope.  Psychiatric: Denied psychiatric symptoms, anxiety and depression.  Endocrine: Denied endocrine symptoms including hot flashes and night sweats.   Meds:   Current Outpatient Medications on File Prior to Visit  Medication Sig Dispense Refill  . cetirizine (ZYRTEC) 10 MG chewable tablet Chew 1 tablet (10 mg total) by mouth daily. 30 tablet 0  . iron polysaccharides (NU-IRON) 150 MG capsule Take 1 capsule (150 mg total) by mouth 2 (two) times daily. 60 capsule 11  . Multiple Vitamins-Iron (MULTIVITAMIN/IRON PO) Take 1 tablet by mouth daily.    Marland Kitchen  fluticasone (FLONASE) 50 MCG/ACT nasal spray Place 1  spray into both nostrils daily for 14 days. 16 g 0   No current facility-administered medications on file prior to visit.       The pregnancy intention screening data noted above was reviewed. Potential methods of contraception were discussed. The patient elected to proceed with Pregnant/Seeking Pregnancy.     Objective:     Vitals:   12/06/20 1010  BP: 116/76  Pulse: 94   Filed Weights   12/06/20 1010  Weight: 237 lb 6.4 oz (107.7 kg)                Assessment:    G0P0 Patient Active Problem List   Diagnosis Date Noted  . Elevated blood pressure reading 11/03/2020  . Depression with anxiety 11/03/2020  . Chronic nonintractable headache 04/14/2019  . Sinus pressure 04/14/2019  . Chronic GERD 07/22/2018  . Constipation 07/22/2018  . PCOS (polycystic ovarian syndrome) 07/22/2018  . Hyperlipidemia 07/22/2018  . Hidradenitis suppurativa 07/22/2018  . Health care maintenance 07/22/2018  . Obesity 07/22/2018  . Anemia   . AV block, 1st degree   . Fibroids 07/19/2012    Class: Chronic     1. Fibroids   2. Encounter for screening mammogram for malignant neoplasm of breast   3. Infertility, female     I explained to the patient that fibroids do sometimes cause infertility but other factors may also be in play.  We have discussed the possibility that she undergo surgery for fibroids and the high risk nature of the surgery because of her history and size of the fibroids was discussed.   Plan:            1.  I recommend an ultrasound to delineate the size and location of fibroids  2.  Partner to have semen analysis to ensure female fertility.  3.  Future consult with Dr. Marcelline Mates for possible robotic myomectomy.  Patient has the understanding that this may not be performed by Dr. Marcelline Mates and that she may be better served by going back to [facility] where she had her previous myomectomy.  We have also discussed the possibility of children via adoption as well as gestational  carrier.  She reports that her sister has agreed to be a gestational carrier for her if necessary.  4.  Mammography screening   Orders Orders Placed This Encounter  Procedures  . MM 3D SCREEN BREAST BILATERAL    No orders of the defined types were placed in this encounter.     F/U  Return in about 6 weeks (around 01/17/2021). I spent 35 minutes involved in the care of this patient preparing to see the patient by obtaining and reviewing her medical history (including labs, imaging tests and prior procedures), documenting clinical information in the electronic health record (EHR), counseling and coordinating care plans, writing and sending prescriptions, ordering tests or procedures and directly communicating with the patient by discussing pertinent items from her history and physical exam as well as detailing my assessment and plan as noted above so that she has an informed understanding.  All of her questions were answered.  Finis Bud, M.D. 12/06/2020 12:07 PM

## 2020-12-07 ENCOUNTER — Encounter: Payer: Self-pay | Admitting: Internal Medicine

## 2020-12-07 ENCOUNTER — Ambulatory Visit (INDEPENDENT_AMBULATORY_CARE_PROVIDER_SITE_OTHER): Payer: 59 | Admitting: Internal Medicine

## 2020-12-07 ENCOUNTER — Other Ambulatory Visit: Payer: Self-pay

## 2020-12-07 VITALS — BP 100/77 | HR 83 | Temp 98.4°F | Ht 64.0 in | Wt 236.1 lb

## 2020-12-07 DIAGNOSIS — R1010 Upper abdominal pain, unspecified: Secondary | ICD-10-CM

## 2020-12-07 DIAGNOSIS — R109 Unspecified abdominal pain: Secondary | ICD-10-CM | POA: Insufficient documentation

## 2020-12-07 DIAGNOSIS — R1013 Epigastric pain: Secondary | ICD-10-CM | POA: Diagnosis not present

## 2020-12-07 MED ORDER — DICYCLOMINE HCL 10 MG PO CAPS: 10.0000 mg | ORAL_CAPSULE | Freq: Every day | ORAL | 0 refills | Status: DC | PRN

## 2020-12-07 NOTE — Progress Notes (Signed)
   CC: Abdominal pain  HPI:  Katie Medina is a 34 y.o. with medical history significant for uterine fibroids status post laparotomy for removal of large uterine fibroid followed by robotic excision of uterine fibroids about 8 years ago.  Also with history of constipation, GERD, IBS-constipation subtype presenting for evaluation of abdominal pain.  Please see problem discharge for further details.   Past Medical History:  Diagnosis Date  . Anemia    otc iron supplement  . AV block, 1st degree   . Fibroid   . GERD (gastroesophageal reflux disease)    otc reflux reducer prn-has not used in last month-assoc with foods  . Infection    UTI   Review of Systems: As per HPI  Physical Exam:  Vitals:   12/07/20 0847  BP: 100/77  Pulse: 83  Temp: 98.4 F (36.9 C)  TempSrc: Oral  SpO2: 100%  Weight: 236 lb 1.6 oz (107.1 kg)  Height: 5\' 4"  (1.626 m)   Physical Exam Vitals and nursing note reviewed.  Cardiovascular:     Rate and Rhythm: Normal rate.     Heart sounds: Normal heart sounds.  Pulmonary:     Breath sounds: Normal breath sounds.  Abdominal:     General: Bowel sounds are normal.     Palpations: Abdomen is soft. There is pulsatile mass. There is no hepatomegaly or mass.     Tenderness: There is abdominal tenderness (ON DEEP PALPATION) in the right upper quadrant. There is no guarding or rebound. Negative signs include Murphy's sign.     Hernia: No hernia is present.  Skin:    Comments: Old robotic surgical scars present.     Assessment & Plan:   See Encounters Tab for problem based charting.  Patient discussed with Dr. Rebeca Alert

## 2020-12-07 NOTE — Patient Instructions (Signed)
Katie Medina,  It was a pleasure taking care of you here in the clinic.  As we discussed, I am going to order an ultrasound of your abdomen.  For your constipation, please continue trying the MiraLAX and the prune juice.  I will give you a short course of Bentyl and see if this helps with the cramps.  Take care! Dr. Eileen Stanford  Please call the internal medicine center clinic if you have any questions or concerns, we may be able to help and keep you from a long and expensive emergency room wait. Our clinic and after hours phone number is [phone], the best time to call is Monday through Friday 9 am to 4 pm but there is always someone available 24/7 if you have an emergency. If you need medication refills please notify your pharmacy one week in advance and they will send [place] a request.   If you have not gotten the COVID vaccine, I recommend doing so:  You may get it at your local [facility] or [facility] OR To schedule an appointment for a COVID vaccine or be added to the vaccine wait list: Go to [email or web address]   OR Go to [email or web address]                  OR Call [phone]                                     OR Call [phone] and select Option 2

## 2020-12-07 NOTE — Assessment & Plan Note (Signed)
Abdominal pain: Katie Medina has medical history significant for uterine fibroids status post laparotomy for removal of large uterine fibroid followed by robotic excision of uterine fibroids about 8 years ago.  Also with history of constipation, GERD, IBS-constipation subtype presenting for evaluation of abdominal pain.  She states she was in her usual state of health until about 4 days ago when she began experiencing epigastric cramp/soreness which worsened each time she lied supine.  She describes the pain as achy in nature and it is constant.  She states that usually with her IBS she would use coffee enema which she has been using for the past year.  On Monday, she used her enema and while she was using the bathroom the epigastric soreness intensified.  She denies fevers, chills, nausea, vomiting or any association with food.  Also denies hematochezia, dysmenorrhea, burning epigastric sensation or any relation with spicy food.  She reports that her last bowel movement was on Monday.  In regards to her IBS, she has tried MiraLAX in the past but states that her body has gotten used to it.  Patient was also evaluated by OB/GYN yesterday for consideration of myomectomy and they are currently in discussion for surgical procedure.  Assessment and plan: Her epigastric discomfort could be secondary to constipation, IBS, GERD.  Hepatic or gallbladder pathology less likely given her illness script.  Plan: -Follow-up right upper quadrant ultrasound -Advised to continue MiraLAX and trial of prune juice -Given a 14-day course of Bentyl

## 2020-12-08 NOTE — Progress Notes (Signed)
Internal Medicine Clinic Attending  Case discussed with Dr. Agyei at the time of the visit.  We reviewed the resident's history and exam and pertinent patient test results.  I agree with the assessment, diagnosis, and plan of care documented in the resident's note.  Maverick Dieudonne, M.D., Ph.D.  

## 2020-12-13 NOTE — Addendum Note (Signed)
Addended by: Hulan Fray on: 12/13/2020 04:39 PM   Modules accepted: Orders

## 2020-12-20 ENCOUNTER — Encounter: Payer: 59 | Admitting: Internal Medicine

## 2020-12-21 ENCOUNTER — Ambulatory Visit (INDEPENDENT_AMBULATORY_CARE_PROVIDER_SITE_OTHER): Payer: 59 | Admitting: Internal Medicine

## 2020-12-21 ENCOUNTER — Other Ambulatory Visit: Payer: Self-pay

## 2020-12-21 DIAGNOSIS — M545 Low back pain, unspecified: Secondary | ICD-10-CM

## 2020-12-21 DIAGNOSIS — D500 Iron deficiency anemia secondary to blood loss (chronic): Secondary | ICD-10-CM | POA: Diagnosis not present

## 2020-12-21 MED ORDER — NAPROXEN 500 MG PO TABS: 500.0000 mg | ORAL_TABLET | Freq: Two times a day (BID) | ORAL | 1 refills | Status: AC | PRN

## 2020-12-21 NOTE — Assessment & Plan Note (Signed)
History of anemia secondary to chronic blood loss from fibroids. Hemoglobin last checked 2019.    - CBC ordered for 12/31 at [facility] - continue iron supplement

## 2020-12-21 NOTE — Assessment & Plan Note (Signed)
She has been having lower back pain for about 30 days, which aches all day and is constant. She has been using ibuprofen and ice and heating pad. The ibuprofen does not seem to help much although other two do. She has not had any recurrent fall although she had a mechanical fall in the beginning of November. No back pain at that time. She works from home and sits a lot during the day. No numbness or radiating pain. She does have intermittent tingling about once per month in her RLE that has been present for years.  She denies fever, dysuria, difficulty with urination or increased frequency. She does have chronic constipation, which is relieved with coffee enemas.  She has a history of PCOS and large uterine fibroids with history of prior removal of fibroids and has [place] ordered to assess for repeat surgery. Last menstual cycle was December 20-25 although she had some spotted yesterday. The pain seems different than previous pain from fibroids.   - no current indication for imaging. Discussed assessing posture with sitting, taking breaks to walk around during work, and benefits of weight loss with back pain  - naproxen 500 mg bid prn  - continue to alternate ice and heat - f/u in one month if symptoms do not improve

## 2020-12-21 NOTE — Progress Notes (Signed)
   CC: lower back pain  This is a telephone encounter between Katie Medina and Katie Medina on 12/21/2020 for lower back pain. The visit was conducted with the patient located at home and Katie Medina at [facility]. The patient's identity was confirmed using their DOB and current address. The patient has consented to being evaluated through a telephone encounter and understands the associated risks (an examination cannot be done and the patient may need to come in for an appointment) / benefits (allows the patient to remain at home, decreasing exposure to coronavirus). I personally spent 20 minutes on medical discussion.   HPI:  [name] is a 34 y.o. with PMH as below.   Please see A&P for assessment of the patient's acute and chronic medical conditions.   Past Medical History:  Diagnosis Date  . Anemia    otc iron supplement  . AV block, 1st degree   . Fibroid   . GERD (gastroesophageal reflux disease)    otc reflux reducer prn-has not used in last month-assoc with foods  . Infection    UTI   Review of Systems:   Review of Systems  Constitutional: Negative for chills, fever and malaise/fatigue.  Gastrointestinal: Negative for abdominal pain, constipation, diarrhea, nausea and vomiting.  Genitourinary: Negative for dysuria, flank pain, frequency, hematuria and urgency.  Musculoskeletal: Positive for back pain and myalgias. Negative for falls and neck pain.  Neurological: Negative for tingling and focal weakness.    Assessment & Plan:   See Encounters Tab for problem based charting.  Patient discussed with Dr. Criselda Peaches

## 2020-12-27 NOTE — Progress Notes (Signed)
Internal Medicine Clinic Attending  Case discussed with Dr. Seawell  At the time of the visit.  We reviewed the resident's history and pertinent patient test results.  I agree with the assessment, diagnosis, and plan of care documented in the resident's note.  

## 2021-01-04 NOTE — Addendum Note (Signed)
Addended by: Hulan Fray on: 01/04/2021 06:22 PM   Modules accepted: Orders

## 2021-01-11 ENCOUNTER — Encounter: Payer: 59 | Admitting: Obstetrics and Gynecology

## 2021-01-11 ENCOUNTER — Other Ambulatory Visit: Payer: 59

## 2021-01-25 ENCOUNTER — Ambulatory Visit (HOSPITAL_COMMUNITY): Payer: 59

## 2021-01-31 ENCOUNTER — Encounter: Payer: 59 | Admitting: Obstetrics and Gynecology

## 2021-01-31 ENCOUNTER — Other Ambulatory Visit: Payer: 59

## 2021-02-23 ENCOUNTER — Ambulatory Visit (HOSPITAL_COMMUNITY): Payer: 59

## 2021-03-01 ENCOUNTER — Other Ambulatory Visit: Payer: 59

## 2021-03-01 ENCOUNTER — Encounter: Payer: 59 | Admitting: Obstetrics and Gynecology

## 2021-03-01 ENCOUNTER — Ambulatory Visit: Payer: 59

## 2021-04-06 ENCOUNTER — Ambulatory Visit: Payer: 59 | Admitting: Obstetrics and Gynecology

## 2021-04-06 ENCOUNTER — Other Ambulatory Visit: Payer: Self-pay

## 2021-04-06 ENCOUNTER — Ambulatory Visit
Admission: RE | Admit: 2021-04-06 | Discharge: 2021-04-06 | Disposition: A | Discharge status: Home / Self Care | Payer: 59 | Source: Ambulatory Visit | Attending: Obstetrics and Gynecology | Admitting: Obstetrics and Gynecology

## 2021-04-06 ENCOUNTER — Encounter: Payer: Self-pay | Admitting: Obstetrics and Gynecology

## 2021-04-06 VITALS — BP 129/86 | HR 102 | Ht 64.0 in | Wt 222.4 lb

## 2021-04-06 DIAGNOSIS — D219 Benign neoplasm of connective and other soft tissue, unspecified: Secondary | ICD-10-CM

## 2021-04-06 DIAGNOSIS — N979 Female infertility, unspecified: Secondary | ICD-10-CM | POA: Diagnosis not present

## 2021-04-06 DIAGNOSIS — D500 Iron deficiency anemia secondary to blood loss (chronic): Secondary | ICD-10-CM | POA: Diagnosis not present

## 2021-04-06 DIAGNOSIS — D252 Subserosal leiomyoma of uterus: Secondary | ICD-10-CM

## 2021-04-06 DIAGNOSIS — D251 Intramural leiomyoma of uterus: Secondary | ICD-10-CM

## 2021-04-06 DIAGNOSIS — E669 Obesity, unspecified: Secondary | ICD-10-CM | POA: Diagnosis not present

## 2021-04-06 DIAGNOSIS — D250 Submucous leiomyoma of uterus: Secondary | ICD-10-CM

## 2021-04-06 DIAGNOSIS — E282 Polycystic ovarian syndrome: Secondary | ICD-10-CM

## 2021-04-06 NOTE — Patient Instructions (Signed)
Total Laparoscopic Hysterectomy A total laparoscopic hysterectomy is a minimally invasive surgery to remove the uterus and cervix. The fallopian tubes and ovaries can also be removed during this surgery, if necessary. This procedure may be done to treat problems such as:  Growths in the uterus (uterine fibroids) that are not cancer but cause symptoms.  A condition that causes the lining of the uterus to grow in other areas (endometriosis).  Problems with pelvic support.  Cancer of the cervix, ovaries, uterus, or tissue that lines the uterus (endometrium).  Excessive bleeding in the uterus. After this procedure, you will no longer be able to have a baby, and you will no longer have a menstrual period. Tell a health care provider about:  Any allergies you have.  All medicines you are taking, including vitamins, herbs, eye drops, creams, and over-the-counter medicines.  Any problems you or family members have had with anesthetic medicines.  Any blood disorders you have.  Any surgeries you have had.  Any medical conditions you have.  Whether you are pregnant or may be pregnant. What are the risks? Generally, this is a safe procedure. However, problems may occur, including:  Infection.  Bleeding.  Blood clots in the legs or lungs.  Allergic reactions to medicines.  Damage to nearby structures or organs.  Having to change from this surgery to one in which a large incision is made in the abdomen (abdominal hysterectomy). What happens before the procedure? Staying hydrated Follow instructions from your health care provider about hydration, which may include:  Up to 2 hours before the procedure - you may continue to drink clear liquids, such as water, clear fruit juice, black coffee, and plain tea.   Eating and drinking restrictions Follow instructions from your health care provider about eating and drinking, which may include:  8 hours before the procedure - stop eating  heavy meals or foods, such as meat, fried foods, or fatty foods.  6 hours before the procedure - stop eating light meals or foods, such as toast or cereal.  6 hours before the procedure - stop drinking milk or drinks that contain milk.  2 hours before the procedure - stop drinking clear liquids. Medicines  Ask your health care provider about: ? Changing or stopping your regular medicines. This is especially important if you are taking diabetes medicines or blood thinners. ? Taking medicines such as aspirin and ibuprofen. These medicines can thin your blood. Do not take these medicines unless your health care provider tells you to take them. ? Taking over-the-counter medicines, vitamins, herbs, and supplements.  You may be asked to take medicine that helps you have a bowel movement (laxative) to prevent constipation. General instructions  If you were asked to do bowel preparation before the procedure, follow instructions from your health care provider.  This procedure can affect the way you feel about yourself. Talk with your health care provider about the physical and emotional changes hysterectomy may cause.  Do not use any products that contain nicotine or tobacco for at least 4 weeks before the procedure. These products include cigarettes, chewing tobacco, and vaping devices, such as e-cigarettes. If you need help quitting, ask your health care provider.  Plan to have a responsible adult take you home from the hospital or clinic.  Plan to have a responsible adult care for you for the time you are told after you leave the hospital or clinic. This is important. Surgery safety Ask your health care provider:  How your surgery  site will be marked.  What steps will be taken to help prevent infection. These may include: ? Removing hair at the surgery site. ? Washing skin with a germ-killing soap. ? Receiving antibiotic medicine. What happens during the procedure?  An IV will be  inserted into one of your veins.  You will be given one or more of the following: ? A medicine to help you relax (sedative). ? A medicine to make you fall asleep (general anesthetic). ? A medicine to numb the area (local anesthetic). ? A medicine that is injected into your spine to numb the area below and slightly above the injection site (spinal anesthetic). ? A medicine that is injected into an area of your body to numb everything below the injection site (regional anesthetic).  A gas will be used to inflate your abdomen. This will allow your surgeon to look inside your abdomen and do the surgery.  Three or four small incisions will be made in your abdomen.  A small device with a light (laparoscope) will be inserted into one of your incisions. Surgical instruments will be inserted through the other incisions in order to perform the procedure.  Your uterus and cervix may be removed through your vagina or cut into small pieces and removed through the small incisions. Any other organs that need to be removed will also be removed this way.  The gas will be released from inside your abdomen.  Your incisions will be closed with stitches (sutures), skin glue, or adhesive strips.  A bandage (dressing) may be placed over your incisions. The procedure may vary among health care providers and hospitals. What happens after the procedure?  Your blood pressure, heart rate, breathing rate, and blood oxygen level will be monitored until you leave the hospital or clinic.  You will be given medicine for pain as needed.  You will be encouraged to walk as soon as possible. You will also use a device to help you breathe or do breathing exercises to keep your lungs clear.  You may have to wear compression stockings. These stockings help to prevent blood clots and reduce swelling in your legs.  You will need to wear a sanitary pad for vaginal discharge or bleeding. Summary  Total laparoscopic  hysterectomy is a procedure to remove your uterus, cervix, and sometimes the fallopian tubes and ovaries.  This procedure can affect the way you feel about yourself. Talk with your health care provider about the physical and emotional changes hysterectomy may cause.  After this procedure, you will no longer be able to have a baby, and you will no longer have a menstrual period.  You will be given pain medicine to control discomfort after this procedure.  Plan to have a responsible adult take you home from the hospital or clinic. This information is not intended to replace advice given to you by your health care provider. Make sure you discuss any questions you have with your health care provider. Document Revised: 08/12/2020 Document Reviewed: 08/12/2020 Elsevier Patient Education  [place].

## 2021-04-06 NOTE — Progress Notes (Incomplete)
GYNECOLOGY PROGRESS NOTE  Subjective:    Patient ID: Katie Medina, female    DOB: 12/10/1986, 35 y.o.   MRN: [id number]  HPI  Patient is a 35 y.o. G0P0 female who presents for follow up of ultrasound results and to discuss surgery for uterine fibroids. She was previously seen by Dr. Jeannie Fend of [facility].  She has a prior history of abnormal menstrual cycles and fibroid uterus, s/p robotic myomectomy in 2019 (notes that the surgery was terminated early due excessive bleeding from removal of large fibroids). She is currently desiring fertility. Has not been on any form of contraception in several years. Had financial issues so canceled several appts before today. . She reports that she had an initial visit with an Infertility doctor several years ago, was started on Metformin but did not proceed with any further workup or management due to cost. Her partner has not had a semen analysis performed.  She reports that her cycles are heavy, but have been more regular since her last surgery. [place] currently wears Depend diapers (changes 3 times daily, while also using a pad or tampon).  She does have a history of anemia. Is taking an iron supplement. Notes her Dermatologist thinks she may have PCOS (has facial hair and acne).   Renetta reports that she Is working to lose weight (dietary modification). Has lost 15 lbs since December (237 lbs)    The following portions of the patient's history were reviewed and updated as appropriate:   GYN History: reports last pap smear was 1-2 years ago, was normal.   She  has a past medical history of Anemia, AV block, 1st degree, Fibroid, GERD (gastroesophageal reflux disease), and Infection.   She  has a past surgical history that includes Mouth surgery; laparotomy (07/17/2012); and Myomectomy (08/2012).   Her family history includes Asthma in her brother; Cancer in her maternal grandfather; Diabetes in her brother, brother, father, maternal  grandmother, and paternal grandmother; HIV/AIDS in her mother; Hypertension in her maternal grandfather; Vision loss in her maternal grandfather.   She  reports that she has never smoked. She has never used smokeless tobacco. She reports previous alcohol use of about 2.0 - 4.0 standard drinks of alcohol per week. She reports that she does not use drugs.   Current Outpatient Medications on File Prior to Visit  Medication Sig Dispense Refill  . cetirizine (ZYRTEC) 10 MG chewable tablet Chew 1 tablet (10 mg total) by mouth daily. 30 tablet 0  . dicyclomine (BENTYL) 10 MG capsule Take 1 capsule (10 mg total) by mouth daily as needed for up to 14 days for spasms. 14 capsule 0  . fluticasone (FLONASE) 50 MCG/ACT nasal spray Place 1 spray into both nostrils daily for 14 days. 16 g 0  . iron polysaccharides (NU-IRON) 150 MG capsule Take 1 capsule (150 mg total) by mouth 2 (two) times daily. 60 capsule 11  . Multiple Vitamins-Iron (MULTIVITAMIN/IRON PO) Take 1 tablet by mouth daily.     No current facility-administered medications on file prior to visit.   She is allergic to penicillins and percocet [oxycodone-acetaminophen]..  Review of Systems Pertinent items noted in HPI and remainder of comprehensive ROS otherwise negative.   Objective:   Blood pressure 129/86, pulse (!) 102, height 5\' 4"  (1.626 m), weight 222 lb 6.4 oz (100.9 kg), last menstrual period 03/04/2021.  Body mass index is 38.17 kg/m. General appearance: alert and no distress Abdomen: normal findings: bowel sounds normal, no organomegaly  and soft and abnormal findings:  obese and mass palpable ~ 3-4 cm above pubic symphsis Pelvic: external genitalia normal, rectovaginal septum normal.  Vagina without discharge. Speculum exam not performed. Uterus mobile, nontender, enlarged, ~ 14-16 week size, slightly irregular contours.  Adnexae non-palpable, nontender bilaterally.  Extremities: extremities normal, atraumatic, no cyanosis or  edema Neurologic: Grossly normal     Imaging:  US PELVIC COMPLETE WITH TRANSVAGINAL CLINICAL DATA:  Fibroids, female infertility, LMP 04/03/2021  EXAM: TRANSABDOMINAL AND TRANSVAGINAL ULTRASOUND OF PELVIS  TECHNIQUE: Both transabdominal and transvaginal ultrasound examinations of the pelvis were performed. Transabdominal technique was performed for global imaging of the pelvis including uterus, ovaries, adnexal regions, and pelvic cul-de-sac. It was necessary to proceed with endovaginal exam following the transabdominal exam to visualize the endometrium and ovaries.  COMPARISON:  06/17/2019  FINDINGS: Uterus  Measurements: 12.4 x 7.5 x 9.7 cm = volume: 474 mL. Anteverted. Enlarged, heterogeneous and nodular containing multiple masses consistent with leiomyomata. Index lesions include a 3.9 cm posterior RIGHT subserosal leiomyoma, 5.1 cm LEFT lateral intramural leiomyoma, and a fundal 6.7 cm subserosal leiomyoma. Some of the smaller leiomyomata appear to extend submucosal.  Endometrium  Thickness: 5 mm. Suboptimally visualized due to distortion by multiple leiomyomata. No obvious endometrial fluid.  Right ovary  Measurements: 4.4 x 3.4 x 4.7 cm = volume: 36 mL. Poorly visualized due to shadowing from uterus. No gross mass.  Left ovary  Measurements: 4.3 x 3.0 x 3.6 cm = volume: 25 mL. Normal morphology without mass  Other findings  No free pelvic fluid.  No adnexal masses.  IMPRESSION: Enlarged uterus containing multiple uterine leiomyomata, some of which extend submucosal.  Remainder of exam unremarkable.  Electronically Signed   By: Lavonia Dana M.D.   On: 04/06/2021 15:41   Assessment:   Infertility Fibroid uterus Menorrhagia History of anemia  Plan:   1. Infertility - unclear cause. She has a history of fibroids , however still was unable to conceive after initial myomectomy in 2013, despite lack of use of contraception. She has not undergone  workup.  Notes that her Dermatologist has suggested diagnosis of PCOS. Will order labs today to assess for other causes of infertility. Patient desires surgical management of fibroids, can consider chromotubation to evaluate for tubal patency.  2. Fibroid uterus - patient desires surgical intervention for fibroids.  Two are subserosal, other large one is intramural.  Several small submucosal fibroids noted.  Discussed that it may not be possible to reach submucosal all fibroids laparoscopically without risk of breach of endometrial cavity. However could consider alternative approach (hysteroscopic myomectomy) during same surgery or schedule for a later surgical date for removal of the submucosal fibroids. May also not necessitate removal if very small size or not creating cavity distortion.  may not require removal. Patient desires surgical management with ***.  The risks of surgery were discussed in detail with the patient including but not limited to: bleeding which may require transfusion or reoperation; infection which may require prolonged hospitalization or re-hospitalization and antibiotic therapy; injury to bowel, bladder, ureters and major vessels or other surrounding organs; formation of adhesions; need for additional procedures including laparotomy or subsequent procedures secondary to abnormal pathology; thromboembolic phenomenon; incisional problems and other postoperative or anesthesia complications.  Patient was told that the likelihood that her condition and symptoms will be treated effectively with this surgical management was very high; the postoperative expectations were also discussed in detail. The patient also understands the alternative treatment options which were  discussed in full. All questions were answered.  She was told that she will be contacted by our surgical scheduler regarding the time and date of her surgery; routine preoperative instructions will be given to her by the  preoperative nursing team.   She is aware of need for preoperative COVID testing and subsequent quarantine from time of test to time of surgery; she will be given further preoperative instructions at that [place] screening visit.  Printed patient education handouts about the procedure were given to the patient to review at home.  June 36 or June 13.

## 2021-04-06 NOTE — Progress Notes (Signed)
GYNECOLOGY PROGRESS NOTE  Subjective:    Patient ID: Katie Medina, female    DOB: 09/01/1986, 35 y.o.   MRN: [id number]  HPI  Patient is a 35 y.o. G0P0 female who presents for follow up of ultrasound results and to discuss surgery for uterine fibroids. She was previously seen by Dr. Jeannie Fend of [facility].  She has a prior history of abnormal menstrual cycles and fibroid uterus, s/p robotic myomectomy in 2019 (notes that the surgery was terminated early due excessive bleeding from removal of large fibroids). She is currently desiring fertility. Has not been on any form of contraception in several years. Had financial issues so canceled several appts before today. . She reports that she had an initial visit with an Infertility doctor several years ago, was started on Metformin but did not proceed with any further workup or management due to cost. Her partner has not had a semen analysis performed.  She reports that her cycles are heavy, but have been more regular since her last surgery. Katie Medina currently wears Depend diapers (changes 3 times daily, while also using a pad or tampon).  She does have a history of anemia. Is taking an iron supplement. Notes her Dermatologist thinks she may have PCOS (has facial hair and acne).   Tyshae reports that she Is working to lose weight (dietary modification). Has lost 15 lbs since December (237 lbs)    The following portions of the patient's history were reviewed and updated as appropriate:   GYN History: reports last pap smear was 1-2 years ago, was normal.   She  has a past medical history of Anemia, AV block, 1st degree, Fibroid, GERD (gastroesophageal reflux disease), and Infection.   She  has a past surgical history that includes Mouth surgery; laparotomy (07/17/2012); and Myomectomy (08/2012).   Her family history includes Asthma in her brother; Cancer in her maternal grandfather; Diabetes in her brother, brother, father, maternal  grandmother, and paternal grandmother; HIV/AIDS in her mother; Hypertension in her maternal grandfather; Vision loss in her maternal grandfather.   She  reports that she has never smoked. She has never used smokeless tobacco. She reports previous alcohol use of about 2.0 - 4.0 standard drinks of alcohol per week. She reports that she does not use drugs.   Current Outpatient Medications on File Prior to Visit  Medication Sig Dispense Refill  . cetirizine (ZYRTEC) 10 MG chewable tablet Chew 1 tablet (10 mg total) by mouth daily. 30 tablet 0  . dicyclomine (BENTYL) 10 MG capsule Take 1 capsule (10 mg total) by mouth daily as needed for up to 14 days for spasms. 14 capsule 0  . fluticasone (FLONASE) 50 MCG/ACT nasal spray Place 1 spray into both nostrils daily for 14 days. 16 g 0  . iron polysaccharides (NU-IRON) 150 MG capsule Take 1 capsule (150 mg total) by mouth 2 (two) times daily. 60 capsule 11  . Multiple Vitamins-Iron (MULTIVITAMIN/IRON PO) Take 1 tablet by mouth daily.     No current facility-administered medications on file prior to visit.   She is allergic to penicillins and percocet [oxycodone-acetaminophen]..  Review of Systems Pertinent items noted in HPI and remainder of comprehensive ROS otherwise negative.   Objective:   Blood pressure 129/86, pulse (!) 102, height 5\' 4"  (1.626 m), weight 222 lb 6.4 oz (100.9 kg), last menstrual period 03/04/2021.  Body mass index is 38.17 kg/m. General appearance: alert and no distress Abdomen: normal findings: bowel sounds normal, no organomegaly  and soft and abnormal findings:  obese and mass palpable ~ 3-4 cm above pubic symphsis Pelvic: external genitalia normal, rectovaginal septum normal.  Vagina without discharge. Speculum exam not performed. Uterus mobile, nontender, enlarged, ~ 14-16 week size, slightly irregular contours.  Adnexae non-palpable, nontender bilaterally.  Extremities: extremities normal, atraumatic, no cyanosis or  edema Neurologic: Grossly normal      Imaging:  US PELVIC COMPLETE WITH TRANSVAGINAL CLINICAL DATA:  Fibroids, female infertility, LMP 04/03/2021  EXAM: TRANSABDOMINAL AND TRANSVAGINAL ULTRASOUND OF PELVIS  TECHNIQUE: Both transabdominal and transvaginal ultrasound examinations of the pelvis were performed. Transabdominal technique was performed for global imaging of the pelvis including uterus, ovaries, adnexal regions, and pelvic cul-de-sac. It was necessary to proceed with endovaginal exam following the transabdominal exam to visualize the endometrium and ovaries.  COMPARISON:  06/17/2019  FINDINGS: Uterus  Measurements: 12.4 x 7.5 x 9.7 cm = volume: 474 mL. Anteverted. Enlarged, heterogeneous and nodular containing multiple masses consistent with leiomyomata. Index lesions include a 3.9 cm posterior RIGHT subserosal leiomyoma, 5.1 cm LEFT lateral intramural leiomyoma, and a fundal 6.7 cm subserosal leiomyoma. Some of the smaller leiomyomata appear to extend submucosal.  Endometrium  Thickness: 5 mm. Suboptimally visualized due to distortion by multiple leiomyomata. No obvious endometrial fluid.  Right ovary  Measurements: 4.4 x 3.4 x 4.7 cm = volume: 36 mL. Poorly visualized due to shadowing from uterus. No gross mass.  Left ovary  Measurements: 4.3 x 3.0 x 3.6 cm = volume: 25 mL. Normal morphology without mass  Other findings  No free pelvic fluid.  No adnexal masses.  IMPRESSION: Enlarged uterus containing multiple uterine leiomyomata, some of which extend submucosal.  Remainder of exam unremarkable.  Electronically Signed   By: Lavonia Dana M.D.   On: 04/06/2021 15:41   Assessment:   Infertility Fibroid uterus Menorrhagia History of anemia PCOS symptoms Obesity  Plan:   1. Infertility - unclear cause. She has a history of fibroids , however still was unable to conceive after initial myomectomy in 2013, despite lack of use of  contraception. She has not undergone workup.  Notes that her Dermatologist has suggested diagnosis of PCOS. Will order labs today to assess for other causes of infertility. Patient desires surgical management of fibroids, can consider chromotubation to evaluate for tubal patency.   2. Fibroid uterus - patient desires surgical intervention for fibroids.  Two are subserosal, other large one is intramural.  Several small submucosal fibroids noted.  Discussed that it may not be possible to reach submucosal all fibroids laparoscopically without risk of breach of endometrial cavity. However could consider alternative approach (hysteroscopic myomectomy) during same surgery or schedule for a later surgical date for removal of the submucosal fibroids. May also not necessitate removal if very small size or not creating cavity distortion.  The risks of surgery were discussed in detail with the patient including but not limited to: bleeding which may require transfusion or reoperation; infection which may require prolonged hospitalization or re-hospitalization and antibiotic therapy; injury to bowel, bladder, ureters and major vessels or other surrounding organs; formation of adhesions; need for additional procedures including laparotomy or subsequent procedures secondary to abnormal pathology; thromboembolic phenomenon; incisional problems and other postoperative or anesthesia complications.  Would plan for robotic myomectomy due to size and patient's obesity. Patient was told that the likelihood that her condition and symptoms will be treated effectively with this surgical management was very high; the postoperative expectations were also discussed in detail. The patient also understands the  alternative treatment options which were discussed in full. All questions were answered.  She was told that she will be contacted by our surgical scheduler regarding the time and date of her surgery; routine preoperative instructions  will be given to her by the preoperative nursing team.   She is aware of need for preoperative COVID testing and subsequent quarantine from time of test to time of surgery; she will be given further preoperative instructions at that [place] screening visit.  Printed patient education handouts about the procedure were given to the patient to review at home.  Desires to plan to have surgery in June (June 6 or 13).   3. Menorrhagia - patient wearing tampons/pads with Depends for heavy cycles.  Discussed management options for her cycles until then.  She notes that she has tried Tranexemic acid in the past and it did not help.  Does not desire use of birth control. Given sample of Myfembree.   4. Iron deficiency anemia - patient has a history of heavy uterine bleeding during her cycles, likely cause. Notes she is taking an iron supplement.  Will check CBC today, last check was in 2020 (at [facility]).   5. Patient with symptoms suggestive of PCOS (hair growth, acne, weight). Also with infertility.  Will order labs.   5. Obesity - patient reports efforts with weight loss. Continued to encourage, to optimize prior to conception.    Rubie Maid, MD [facility]

## 2021-04-06 NOTE — Progress Notes (Signed)
GYN-Pt following up for ultrasound results and semen analysis. Pt stated that she was doing well.

## 2021-04-08 ENCOUNTER — Encounter: Payer: Self-pay | Admitting: Obstetrics and Gynecology

## 2021-04-08 LAB — COMPREHENSIVE METABOLIC PANEL
ALT: 10 IU/L (ref 0–32)
AST: 16 IU/L (ref 0–40)
Albumin/Globulin Ratio: 1.6 (ref 1.2–2.2)
Albumin: 4.5 g/dL (ref 3.8–4.8)
Alkaline Phosphatase: 63 IU/L (ref 44–121)
BUN/Creatinine Ratio: 8 — ABNORMAL LOW (ref 9–23)
BUN: 7 mg/dL (ref 6–20)
Bilirubin Total: 0.2 mg/dL (ref 0.0–1.2)
CO2: 14 mmol/L — ABNORMAL LOW (ref 20–29)
Calcium: 9.5 mg/dL (ref 8.7–10.2)
Chloride: 101 mmol/L (ref 96–106)
Creatinine, Ser: 0.93 mg/dL (ref 0.57–1.00)
Globulin, Total: 2.9 g/dL (ref 1.5–4.5)
Glucose: 75 mg/dL (ref 65–99)
Potassium: 4.2 mmol/L (ref 3.5–5.2)
Sodium: 142 mmol/L (ref 134–144)
Total Protein: 7.4 g/dL (ref 6.0–8.5)
eGFR: 82 mL/min/{1.73_m2} (ref >59)

## 2021-04-08 LAB — SPECIMEN STATUS REPORT

## 2021-04-14 LAB — TESTOSTERONE, FREE, TOTAL, SHBG
Sex Hormone Binding: 84 nmol/L (ref 24.6–122.0)
Testosterone, Free: 1.6 pg/mL (ref 0.0–4.2)
Testosterone: 36 ng/dL (ref 8–60)

## 2021-04-14 LAB — PROLACTIN: Prolactin: 13.1 ng/mL (ref 4.8–23.3)

## 2021-04-14 LAB — FSH/LH
FSH: 5.2 m[IU]/mL
LH: 6.1 m[IU]/mL

## 2021-04-14 LAB — DHEA-SULFATE: DHEA-SO4: 94.9 ug/dL (ref 57.3–279.2)

## 2021-04-14 LAB — ESTRADIOL: Estradiol: 53 pg/mL

## 2021-04-14 LAB — INSULIN, FREE AND TOTAL
Free Insulin: 13 uU/mL
Total Insulin: 13 uU/mL

## 2021-04-14 LAB — TSH: TSH: 0.92 u[IU]/mL (ref 0.450–4.500)

## 2021-04-14 LAB — PROGESTERONE: Progesterone: 0.6 ng/mL

## 2021-04-17 ENCOUNTER — Telehealth: Payer: Self-pay | Admitting: Obstetrics and Gynecology

## 2021-04-17 NOTE — Telephone Encounter (Signed)
Patient called in and states she was supposed to give Dr. Marcelline Mates a surgery date.  Patient would like July 11th.   Patient states for a myomectomy.

## 2021-04-18 NOTE — Telephone Encounter (Signed)
Ok.  We will work on getting her scheduled for this.  She will need to have a pre-op appointment with me approximately 2 weeks prior to this date.

## 2021-04-26 ENCOUNTER — Encounter: Payer: Self-pay | Admitting: Dietician

## 2021-04-26 ENCOUNTER — Ambulatory Visit (INDEPENDENT_AMBULATORY_CARE_PROVIDER_SITE_OTHER): Payer: 59 | Admitting: Dietician

## 2021-04-26 DIAGNOSIS — E6609 Other obesity due to excess calories: Secondary | ICD-10-CM

## 2021-04-26 DIAGNOSIS — Z713 Dietary counseling and surveillance: Secondary | ICD-10-CM | POA: Diagnosis not present

## 2021-04-26 DIAGNOSIS — Z6839 Body mass index (BMI) 39.0-39.9, adult: Secondary | ICD-10-CM | POA: Diagnosis not present

## 2021-04-26 NOTE — Progress Notes (Signed)
Medical Nutrition Therapy:  Appt start time: 1320 end time:  1408. Total time: 34 Visit # 1   Assessment:  Primary concerns today: wants to adopt a healthy eating pattern/lifestyle to prevent chronic disease. She reports a family history of high blood pressure and diabetes.  She also wants to conceive and be doing this prior to conception. Ms. Tomei has already been decreasing her animal products and eating more plant based. Her spouse also wants to adopt a healthy lifestyle. The share food shopping and cooking chores. She works remotely from home 8AM-6 PM. She does not currently read labels but is interested in learning how.   Preferred Learning Style: No preference indicated  Learning Readiness: Change in progress  ANTHROPOMETRICS:Estimated body mass index is 39.02 kg/m as calculated from the following:   Height as of 04/06/21: 5\' 4"  (1.626 m).   Weight as of this encounter: 227 lb 4.8 oz (103.1 kg).  WEIGHT HISTORY: 245# has been the highest weight SLEEP:9 pm- 7 am but does not sleep soundly until ~ 2 am when spouse comes home from work  MEDICATIONS:  Current Outpatient Medications on File Prior to Visit  Medication Sig Dispense Refill  . cetirizine (ZYRTEC) 10 MG chewable tablet Chew 1 tablet (10 mg total) by mouth daily. 30 tablet 0  . dicyclomine (BENTYL) 10 MG capsule Take 1 capsule (10 mg total) by mouth daily as needed for up to 14 days for spasms. 14 capsule 0  . fluticasone (FLONASE) 50 MCG/ACT nasal spray Place 1 spray into both nostrils daily for 14 days. 16 g 0  . iron polysaccharides (NU-IRON) 150 MG capsule Take 1 capsule (150 mg total) by mouth 2 (two) times daily. 60 capsule 11  . Multiple Vitamins-Iron (MULTIVITAMIN/IRON PO) Take 1 tablet by mouth daily.     No current facility-administered medications on file prior to visit.    BLOOD SUGAR:  Lab Results  Component Value Date   HGBA1C 5.6 07/22/2018    DIETARY INTAKE: Usual eating pattern includes 2-3 meals  and unsure snacks per day. Everyday foods include vegetables, nuts, hot dogs, fish- salmon and tilapia  Avoided foods include need to assess at future visit, Constipation and hair loss need to assess at future  Dining Out (times/week): ~ 2+x 24-hr recall:  B ( 8am-12 PM): 2 large ( ? 12- 16 oz)cups cold coffee with almond milk and creamer and stevia L ( PM): sauteed tofu with brussels sprouts and riced cauliflower D ( 530 PM): out when off work) chick [employer], [place] [employer]), leftovers when working [employer] - tries not to eat after dinner Beverages: coffee, soda, need to assess in more detail  Usual physical activity: used to walk daily and is preparing/thinking about restarting  In order to maintain  current weight: 2,389 Calories/day To reach goal of 210 lbs in 180 days:  1,941 Calories/day Progress Towards Goal(s):  In progress.   Nutritional Diagnosis:  NB-1.1 Food and nutrition-related knowledge deficit As related to lack of sufficient meal planning education.  As evidenced by her report and questions.    Intervention:  Nutrition education about healthy food choices concentrating on plant sources of protein and increased nutrient density. Action Goal:record 3 days of food intake and feelings when eating  Outcome goal: increasing knowledge of healthy chronic disease prevention meal planning Coordination of care:   Teaching Method Utilized: Visual, Auditory,Hands on Handouts given during visit include:3 day food record, avs Barriers to learning/adherence to lifestyle change: competing values Demonstrated degree  of understanding via:  Teach Back   Monitoring/Evaluation:  Dietary intake, exercise, food record, and body weight in 3 week(s)  Debera Lat, RD 04/26/2021 5:11 PM. .

## 2021-04-26 NOTE — Patient Instructions (Addendum)
Writing down what we eat and drink can help [place] to see it in a different way. It may help [place] to change habits. What we eat and drink are habits formed during our lifetime. Habits are learned and can be unlearned and changed.   Please write down the foods and beverages you have during the day. Please include at least the time you eat and drink, what you eat and drink and how much you eat and drink. Includes as much detail as you can.  For example:  Time   What    How much 8 AM  Cooked oatmeal   1 Cup   Coffee     1 mug                         Sugar in oatmeal   1 teaspoon         Sugar in coffee   2 teaspoons   Milk in oatmeal   1/4 cup   Creamer in coffee    2 teaspoons   Margarine in oatmeal   1 teaspoon  Eat more... 1. Whole grains: whole wheat cereal, crackers, bread, pasta, old fashioned oats & brown rice 2. Whole fruits-1 cup a day 3. Vegetables- 2-3 cups a day 4. Lowfat Protein: Chicken, [place], lean cuts of beef and pork, fish 2-3 times a week 5. Nuts, Seeds and Soy:add walnuts to cereal, peanut butter sandwich  Eat Less... 1. Saturated & Transfats- mostly from red meat, high fat dairy like milk, ice cream, coffee creamer, snack foods like chips, pork rind, fried foods 2. Added Sugar: artifical sweeteners can help 3. Sodium:Use spices instead of salt, avoid salty foods like soup, crackers, chips, lunch meat, sausage, hot dogs  Avoid.... . High-fructose corn syrup and "other sugars" . Partially hydrogenated vegetable oils . Trans fatty acids . Nondairy coffee creamer Of course, too much of almost any food.   Please schedule 2-4 weeks follow up.   Butch Penny [phone]

## 2021-05-18 ENCOUNTER — Encounter: Payer: 59 | Admitting: Dietician

## 2021-05-18 ENCOUNTER — Institutional Professional Consult (permissible substitution): Payer: 59 | Admitting: Behavioral Health

## 2021-06-07 ENCOUNTER — Institutional Professional Consult (permissible substitution): Payer: 59 | Admitting: Behavioral Health

## 2021-06-07 ENCOUNTER — Encounter: Payer: 59 | Admitting: Dietician

## 2021-06-21 ENCOUNTER — Ambulatory Visit (INDEPENDENT_AMBULATORY_CARE_PROVIDER_SITE_OTHER): Payer: 59 | Admitting: Obstetrics and Gynecology

## 2021-06-21 ENCOUNTER — Other Ambulatory Visit: Payer: Self-pay

## 2021-06-21 ENCOUNTER — Encounter: Payer: Self-pay | Admitting: Obstetrics and Gynecology

## 2021-06-21 VITALS — BP 117/78 | HR 91 | Ht 64.0 in | Wt 223.3 lb

## 2021-06-21 DIAGNOSIS — Z01818 Encounter for other preprocedural examination: Secondary | ICD-10-CM | POA: Diagnosis not present

## 2021-06-21 DIAGNOSIS — Z9889 Other specified postprocedural states: Secondary | ICD-10-CM

## 2021-06-21 DIAGNOSIS — D250 Submucous leiomyoma of uterus: Secondary | ICD-10-CM

## 2021-06-21 DIAGNOSIS — N979 Female infertility, unspecified: Secondary | ICD-10-CM

## 2021-06-21 DIAGNOSIS — Z862 Personal history of diseases of the blood and blood-forming organs and certain disorders involving the immune mechanism: Secondary | ICD-10-CM

## 2021-06-21 DIAGNOSIS — D251 Intramural leiomyoma of uterus: Secondary | ICD-10-CM

## 2021-06-21 DIAGNOSIS — D252 Subserosal leiomyoma of uterus: Secondary | ICD-10-CM

## 2021-06-21 DIAGNOSIS — N920 Excessive and frequent menstruation with regular cycle: Secondary | ICD-10-CM

## 2021-06-21 NOTE — Patient Instructions (Addendum)
GYNECOLOGY PREOPERATIVE INSTRUCTIONS   - You are scheduled for surgery on 07/03/2021. Your procedure is: Robotic myomectomy and hysteroscopic myomectomy, chromotubation - Nothing to eat after midnight on day prior to surgery.  - Do not take any medications unless recommended by your provider on day prior to surgery.  - Do not take NSAIDs (Motrin, Aleve) or aspirin 7 days prior to surgery.  You may take Tylenol products for minor aches and pains.  - You will receive a prescription for pain medications post-operatively.  - You will be contacted by phone approximately 1 week prior to surgery to schedule pre-operative appointment.  - Please call the office if you have any questions regarding your upcoming surgery.     Robot-Assisted Laparoscopic Surgery Information Robot-assisted laparoscopic surgery is a type of procedure that allows a surgeon to use robotic arms to control tools and a camera. Unlike traditional (open) surgery in which a large incision is made, this type of surgery uses a few small incisions (minimally invasive surgery). Laparoscopic surgery means that the surgery is performed in the abdomen using a thin, flexible tube that has a light and a camera on the end (laparoscope). The image from the camera is shown on a monitor. This type of surgery is commonly used for: Prostate surgery. Surgery on the uterus or ovaries. Kidney surgery. Colon surgery. Hernia repair surgery. Bariatric surgery. Tell a health care provider about: Any allergies you have. All medicines you are taking, including vitamins, herbs, eye drops, creams, and over-the-counter medicines. Any problems you or family members have had with anesthetic medicines. Any blood disorders you have. Any surgeries you have had. Any medical conditions you have. Whether you are pregnant or may be pregnant. What are the risks and benefits? This type of surgery has many of the same risks as regular, open surgery. Generally,  this is a safe procedure. However, problems may occur, including: Infection. Bleeding. Damage to other structures or organs. Allergic reactions to medicines, such as anesthetics. Pain, bruising, and swelling. Other side effects may depend on the type of surgery that is done. For example,prostate surgery may cause problems with urination. This type of surgery also has benefits, mostly because the incisions for this type of surgery are smaller. This may result in: A shorter hospital stay. Faster healing time. Less blood loss. Less scarring. Less pain. In some cases, this type of surgery may also take less time to do than regular,open surgery. What happens before the surgery? Before the surgery, go over the procedures that will take place with your health care provider. Ask your health care provider: What is the goal of the surgery? Is this surgery the best treatment option? What other options do I have? Does the surgeon have experience with this type of surgery? How much experience does the surgeon have? How long will surgery take? How long will it take to recover from surgery? What are the costs, and how do they compare to other surgical choices? How should I prepare for surgery? What happens during the surgery? There are differences between surgeries done with and surgeries done without robot assistance. These include: During open surgery, a large incision is made. This allows the surgeon to access the organ or body part that he or she is operating on. During these procedures, the surgeon holds the surgical tools in her or his hands. During a laparoscopic procedure, a few small incisions are made. A camera is inserted so the surgeon can see the body part. The surgeon holds the  tools in her or his hands to reach the body part through the smaller incisions. During a robot-assisted laparoscopic procedure, a few small incisions are made. The surgeon sits at a console in the operating room and  uses the controls at the console to move the robotic arms. The surgical tools and laparoscope are attached to the ends of the robotic arms. Ask your health care professional about any concerns you may have. What can I expect after the surgery? After your procedure, it is common to have: Mild discomfort in the abdomen. Soreness in the incision areas. Follow these instructions at home: Rest as told by your health care provider. Avoid sitting for a long time without moving. Get up to take short walks every 1-2 hours. This is important to improve blood flow and breathing. Ask for help if you feel weak or unsteady. Drink enough fluid to keep your urine pale yellow. Do not use any products that contain nicotine or tobacco. These products include cigarettes, chewing tobacco, and vaping devices, such as e-cigarettes. These can delay healing after surgery. If you need help quitting, ask your health care provider. Keep all follow-up visits. This is important. Summary Robot-assisted laparoscopic surgery is a procedure that allows the surgeon to use robotic arms to control surgical tools and a camera. Unlike traditional (open) surgery, which involves a large incision, this type of surgery uses a few small incisions. This type of surgery has many of the same risks as regular, open surgery. This surgery may result in a shorter hospital stay, a faster recovery time, and less bleeding, pain, and scarring compared to open surgery. This information is not intended to replace advice given to you by your health care provider. Make sure you discuss any questions you have with your healthcare provider. Document Revised: 08/05/2020 Document Reviewed: 08/05/2020 Elsevier Patient Education  2022 [employer].

## 2021-06-21 NOTE — Progress Notes (Signed)
GYNECOLOGY PROGRESS NOTE  Subjective:    Patient ID: Katie Medina, female    DOB: [birth year], 35 y.o.   MRN: [id number]  HPI  Patient is a 35 y.o. G0P0 female who presents for further discussion of surgical management and pre-operative examination. She has a history of infertility, fibroid uterus, and menorrhagia. She also has a past history of anemia.  Has been passively taking her iron supplements. She desires surgical intervention with myomectomy and evaluation of tubal patency. She recently underwent evaluation for PCOS, labs were negative.   She also reports that her husband had a semen analysis performed.  Has not heard back regarding his results.   Nevea also notes that she did not perform trial of Myfembree for management of her cycles as she read over the side effects and was afraid to initiate.   The following portions of the patient's history were reviewed and updated as appropriate: allergies, current medications, past family history, past medical history, past social history, past surgical history, and problem list.  Review of Systems Pertinent items noted in HPI and remainder of comprehensive ROS otherwise negative.   Objective:   Blood pressure 117/78, pulse 91, height 5\' 4"  (1.626 m), weight 223 lb 4.8 oz (101.3 kg), last menstrual period 05/26/2021. General appearance: alert and no distress Lungs: clear to auscultation bilaterally Heart: regular rate and rhythm, S1, S2 normal, no murmur, click, rub or gallop  Abdomen: normal findings: bowel sounds normal, no organomegaly and soft and abnormal findings:  obese and mass palpable ~ 3-4 cm above pubic symphsis Pelvic: deferred (see exam performed 04/06/2021) Extremities: extremities normal, atraumatic, no cyanosis or edema Neurologic: Alert and oriented X 3, normal strength and tone. Normal symmetric reflexes. Normal coordination and gait   Assessment:   1. Preoperative exam for gynecologic surgery   2. History  of anemia   3. Intramural, submucous, and subserous leiomyoma of uterus   4. Infertility, female   82. Menorrhagia with regular cycle   6. History of myomectomy    Plan:   Fibroid uterus - patient desires surgical intervention for fibroids.  Two are subserosal, other large one is intramural.  Several small submucosal fibroids noted.  Discussed that it may not be possible to reach submucosal all fibroids laparoscopically without risk of breach of endometrial cavity. However could consider alternative approach (hysteroscopic myomectomy) during same surgery or schedule for a later surgical date for removal of the submucosal fibroids. May also not necessitate removal if very small size or not creating cavity distortion.  The risks of surgery were discussed in detail with the patient including but not limited to: bleeding which may require transfusion or reoperation; infection which may require prolonged hospitalization or re-hospitalization and antibiotic therapy; injury to bowel, bladder, ureters and major vessels or other surrounding organs; formation of adhesions; need for additional procedures including laparotomy or subsequent procedures secondary to abnormal pathology; thromboembolic phenomenon; incisional problems and other postoperative or anesthesia complications.  Would plan for robotic myomectomy due to uterine size and patient's obesity. Patient was told that the likelihood that her condition and symptoms will be treated effectively with this surgical management was very high; the postoperative expectations were also discussed in detail. The patient also understands the alternative treatment options which were discussed in full. All questions were answered.  She was told that she will be contacted by our surgical scheduler regarding the time and date of her surgery; routine preoperative instructions will be given to her by the preoperative  nursing team.   She is aware of need for preoperative COVID  testing and subsequent quarantine from time of test to time of surgery; she will be given further preoperative instructions at that [place] screening visit.  Printed patient education handouts about the procedure were given to the patient to review at home. Surgery scheduled for 07/03/2021  Infertility - unclear cause. She has a history of fibroids , however still was unable to conceive after initial myomectomy in 2013, despite lack of use of contraception. Recent PCOS workup was normal. Partner recently had semen analysis, noted low volume (1.2 ml, normal is > 1.4 ml) and elevated leukocyte concentration (1.00, cut off is  less than 1.00).  Discussed that he may need to follow up with a Urologist or reproductive specialist as this could have been due to issues with sample collection, or may be an indicator of retrorade ejaculation, low testosterone, or or other issue. Is also possible that she could still get pregnant with his low semen volume as the concentration and motility of sperm is still normal.  Menorrhagia - notes menstrual cycles are still heavy.  Did not perform trial of Myfembree. Advised on other options to manage cycles until surgery, including Aygestin.  Has tried Myfembree in the past. Does not desire use of birth control as she desires to conceive.  Will prescribe Aygestin.  H/o anemia, not taking her iron supplements regularly. Will check Hgb today and iron studies today.    A total of 25 minutes were spent face-to-face with the patient during this encounter and over half of that time involved counseling and coordination of care.  Rubie Maid, MD [facility]

## 2021-06-21 NOTE — Progress Notes (Signed)
Pt present for pre-op exam. Pt stated that she was doing well.

## 2021-06-22 ENCOUNTER — Telehealth: Payer: Self-pay | Admitting: Obstetrics and Gynecology

## 2021-06-22 ENCOUNTER — Other Ambulatory Visit: Payer: Self-pay | Admitting: Obstetrics and Gynecology

## 2021-06-22 LAB — COMPREHENSIVE METABOLIC PANEL
ALT: 8 IU/L (ref 0–32)
AST: 9 IU/L (ref 0–40)
Albumin/Globulin Ratio: 1.4 (ref 1.2–2.2)
Albumin: 4.4 g/dL (ref 3.8–4.8)
Alkaline Phosphatase: 79 IU/L (ref 44–121)
BUN/Creatinine Ratio: 8 — ABNORMAL LOW (ref 9–23)
BUN: 7 mg/dL (ref 6–20)
Bilirubin Total: 0.2 mg/dL (ref 0.0–1.2)
CO2: 18 mmol/L — ABNORMAL LOW (ref 20–29)
Calcium: 9.3 mg/dL (ref 8.7–10.2)
Chloride: 99 mmol/L (ref 96–106)
Creatinine, Ser: 0.91 mg/dL (ref 0.57–1.00)
Globulin, Total: 3.1 g/dL (ref 1.5–4.5)
Glucose: 65 mg/dL (ref 65–99)
Potassium: 4.7 mmol/L (ref 3.5–5.2)
Sodium: 139 mmol/L (ref 134–144)
Total Protein: 7.5 g/dL (ref 6.0–8.5)
eGFR: 83 mL/min/{1.73_m2} (ref >59)

## 2021-06-22 LAB — IRON,TIBC AND FERRITIN PANEL
Ferritin: 18 ng/mL (ref 15–150)
Iron Saturation: 2 % — CL (ref 15–55)
Iron: 10 ug/dL — ABNORMAL LOW (ref 27–159)
Total Iron Binding Capacity: 463 ug/dL — ABNORMAL HIGH (ref 250–450)
UIBC: 453 ug/dL — ABNORMAL HIGH (ref 131–425)

## 2021-06-22 LAB — CBC
Hematocrit: 25.3 % — ABNORMAL LOW (ref 34.0–46.6)
Hemoglobin: 6.4 g/dL — CL (ref 11.1–15.9)
MCH: 15.2 pg — ABNORMAL LOW (ref 26.6–33.0)
MCHC: 25.3 g/dL — ABNORMAL LOW (ref 31.5–35.7)
MCV: 60 fL — ABNORMAL LOW (ref 79–97)
Platelets: 412 10*3/uL (ref 150–450)
RBC: 4.22 x10E6/uL (ref 3.77–5.28)
RDW: 20.5 % — ABNORMAL HIGH (ref 11.7–15.4)
WBC: 7.2 10*3/uL (ref 3.4–10.8)

## 2021-06-22 MED ORDER — NORETHINDRONE ACETATE 5 MG PO TABS: 5.0000 mg | ORAL_TABLET | Freq: Every day | ORAL | 0 refills | Status: DC

## 2021-06-22 NOTE — Telephone Encounter (Signed)
Contacted patient about lab results.  Significant anemia noted. Patient overall asymptomatic.  Is planning for surgery in 2 weeks for management of her fibroids and bleeding.  Discussed options for management, including outpatient blood transfusion, iron infusions or oral therapy.  If iron infusion or oral therapy desired, discussed need for postponing surgery to allow time for therapy to work. Patient notes she desires to proceed with blood transfusion as she does not want to reschedule her surgery. Discussed risks, benefits of blood transfusion. Will schedule for outpatient infusion center for tomorrow. Also advised that she will need to increase her iron tablet dosing (recently changed from ferrous sulfate to [place]). Orders placed.     Katie Maid, MD [facility]

## 2021-06-23 ENCOUNTER — Other Ambulatory Visit: Payer: Self-pay

## 2021-06-23 ENCOUNTER — Ambulatory Visit
Admission: RE | Admit: 2021-06-23 | Discharge: 2021-06-23 | Disposition: A | Discharge status: Home / Self Care | Payer: 59 | Source: Ambulatory Visit | Attending: Obstetrics and Gynecology | Admitting: Obstetrics and Gynecology

## 2021-06-23 DIAGNOSIS — D500 Iron deficiency anemia secondary to blood loss (chronic): Secondary | ICD-10-CM | POA: Insufficient documentation

## 2021-06-23 DIAGNOSIS — N921 Excessive and frequent menstruation with irregular cycle: Secondary | ICD-10-CM | POA: Insufficient documentation

## 2021-06-23 LAB — HEMOGLOBIN AND HEMATOCRIT, BLOOD
HCT: 30.4 % — ABNORMAL LOW (ref 36.0–46.0)
HCT: 32.6 % — ABNORMAL LOW (ref 36.0–46.0)
Hemoglobin: 8.6 g/dL — ABNORMAL LOW (ref 12.0–15.0)
Hemoglobin: 9.5 g/dL — ABNORMAL LOW (ref 12.0–15.0)

## 2021-06-23 LAB — PREPARE RBC (CROSSMATCH)

## 2021-06-23 MED ORDER — SODIUM CHLORIDE 0.9% IV SOLUTION: Freq: Once | INTRAVENOUS | Status: AC

## 2021-06-23 MED ORDER — ACETAMINOPHEN 500 MG PO TABS: 1000.0000 mg | ORAL_TABLET | Freq: Once | ORAL | Status: AC

## 2021-06-23 MED ORDER — FUROSEMIDE 10 MG/ML IJ SOLN: 20.0000 mg | Freq: Once | INTRAMUSCULAR | Status: AC

## 2021-06-23 MED ORDER — DIPHENHYDRAMINE HCL 25 MG PO CAPS: 25.0000 mg | ORAL_CAPSULE | Freq: Once | ORAL | Status: AC

## 2021-06-23 MED ORDER — FUROSEMIDE 10 MG/ML IJ SOLN
INTRAMUSCULAR | Status: AC
  Administered 2021-06-23: 20 mg via INTRAVENOUS
  Filled 2021-06-23: qty 2

## 2021-06-23 MED ORDER — DIPHENHYDRAMINE HCL 25 MG PO CAPS
ORAL_CAPSULE | ORAL | Status: AC
  Administered 2021-06-23: 25 mg via ORAL
  Filled 2021-06-23: qty 1

## 2021-06-23 MED ORDER — ACETAMINOPHEN 500 MG PO TABS
ORAL_TABLET | ORAL | Status: AC
  Administered 2021-06-23: 1000 mg via ORAL
  Filled 2021-06-23: qty 2

## 2021-06-24 ENCOUNTER — Encounter: Payer: Self-pay | Admitting: Obstetrics and Gynecology

## 2021-06-24 LAB — TYPE AND SCREEN
ABO/RH(D): B POS
Antibody Screen: NEGATIVE
Unit division: 0
Unit division: 0

## 2021-06-24 LAB — BPAM RBC
Blood Product Expiration Date: 202207282359
Blood Product Expiration Date: 202207282359
ISSUE DATE / TIME: 202207011133
ISSUE DATE / TIME: 202207011535
Unit Type and Rh: 7300
Unit Type and Rh: 7300

## 2021-06-24 NOTE — H&P (Signed)
[facility] [address] [place], [place]  [address] Phone:  [phone]   Fax:  [phone]   GYNECOLOGY PREOPERATIVE HISTORY AND PHYSICAL   Subjective:  Katie Medina is a 35 y.o. G0P0 here for surgical management of fibroid uterus, menorrhagia, anemia, and primary infertility.  Of note patient has a history of prior laparotomy and robotic myomectomy in the past (notes that the surgery was terminated early due concerns for excessive bleeding from removal of large fibroids and no blood products available in the hospital; waited 6 weeks later to have surgery performed robotically at another facility). She reports that her cycles are heavy, but have been more regular since her last surgery. [name] currently wears Depend diapers (changes 3 times daily, while also using a pad or tampon). She has a history of anemia, usually treating with iron supplementation.   Proposed surgery: Robotic myomectomy, possible hysteroscopic hysterectomy    Pertinent Gynecological History: Patient's last menstrual period was 05/26/2021. Contraception: none Last pap: 1-2 years ago, normal per patient.    Past Medical History:  Diagnosis Date   Anemia    otc iron supplement   AV block, 1st degree    Fibroid    GERD (gastroesophageal reflux disease)    otc reflux reducer prn-has not used in last month-assoc with foods   Infection    UTI    Past Surgical History:  Procedure Laterality Date   LAPAROTOMY  07/17/2012   Procedure: EXPLORATORY LAPAROTOMY;  Surgeon: Avel Sensor, MD;  Location: [facility];  Service: Gynecology;  Laterality: N/A;   MOUTH SURGERY     MYOMECTOMY  08/2012    OB History  Gravida Para Term Preterm AB Living  0            SAB IAB Ectopic Multiple Live Births               Family History  Problem Relation Age of Onset   Diabetes Father    Asthma Brother    Diabetes Maternal Grandmother    Hypertension Maternal Grandfather    Cancer  Maternal Grandfather        prostate   Vision loss Maternal Grandfather    HIV/AIDS Mother    Diabetes Brother    Diabetes Brother    Diabetes Paternal Grandmother    Hearing loss Neg Hx     Social History   Socioeconomic History   Marital status: Married    Spouse name: Not on file   Number of children: Not on file   Years of education: Not on file   Highest education level: Not on file  Occupational History   Not on file  Tobacco Use   Smoking status: Never   Smokeless tobacco: Never  Vaping Use   Vaping Use: Never used  Substance and Sexual Activity   Alcohol use: Yes    Alcohol/week: 2.0 - 4.0 standard drinks    Types: 1 - 2 Glasses of wine, 1 - 2 Shots of liquor per week    Comment: usally specilal occasions   Drug use: No   Sexual activity: Yes    Birth control/protection: None  Other Topics Concern   Not on file  Social History Narrative   Entered 03/2015:   Lives with her spouse. 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 (Patient not taking: No sig reported) 30 tablet 0   fluticasone (FLONASE) 50 MCG/ACT nasal spray Place 1 spray into both nostrils daily for 14 days. (Patient taking differently: Place 1 spray into both nostrils daily as needed for allergies.) 16 g 0   iron polysaccharides (NU-IRON) 150 MG capsule Take 1 capsule (150 mg total) by mouth 2 (two) times daily. (Patient not taking: Reported on 06/23/2021) 60 capsule 11   No current facility-administered medications on file prior to visit.     Allergies  Allergen Reactions   Penicillins     Unknown reaction   Percocet [Oxycodone-Acetaminophen] Other (See Comments)    Causes back pain.      Review of Systems Constitutional: No recent fever/chills/sweats Respiratory: No recent cough/bronchitis Cardiovascular: No chest pain Gastrointestinal: No recent nausea/vomiting/diarrhea Genitourinary: No UTI symptoms Hematologic/lymphatic:No history of coagulopathy or recent blood thinner use    Objective:   Blood pressure 117/78, pulse 91, height '5\' 4"'  (1.626 m), weight 223 lb 4.8 oz (101.3 kg), last menstrual period 05/26/2021. CONSTITUTIONAL: Well-developed, well-nourished female in no acute distress.  HENT:  Normocephalic, atraumatic, External right and left ear normal. Oropharynx is clear and moist EYES: Conjunctivae and EOM are normal. Pupils are equal, round, and reactive to light. No scleral icterus.  NECK: Normal range of motion, supple, no masses SKIN: Skin is warm and dry. No rash noted. Not diaphoretic. No erythema. No pallor. NEUROLOGIC: Alert and oriented to person, place, and time. Normal reflexes, muscle tone coordination. No cranial nerve deficit noted. PSYCHIATRIC: Normal mood and affect. Normal behavior. Normal judgment and thought content. CARDIOVASCULAR: Normal heart rate noted, regular rhythm RESPIRATORY: Effort and breath sounds normal, no problems with respiration noted ABDOMEN: Soft, nontender, nondistended. PELVIC: Deferred MUSCULOSKELETAL: Normal range of motion. No edema and no tenderness. 2+ distal pulses.    Labs: Results for orders placed or performed in visit on 06/21/21 (from the past 336 hour(s))  CBC   Collection Time: 06/21/21 11:36 AM  Result Value Ref Range   WBC 7.2 3.4 - 10.8 x10E3/uL   RBC 4.22 3.77 - 5.28 x10E6/uL   Hemoglobin 6.4 (LL) 11.1 - 15.9 g/dL   Hematocrit 25.3 (L) 34.0 - 46.6 %   MCV 60 (L) 79 - 97 fL   MCH 15.2 (L) 26.6 - 33.0 pg   MCHC 25.3 (L) 31.5 - 35.7 g/dL    RDW 20.5 (H) 11.7 - 15.4 %   Platelets 412 150 - 450 x10E3/uL  Comp Met (CMET)   Collection Time: 06/21/21 11:36 AM  Result Value Ref Range   Glucose 65 65 - 99 mg/dL   BUN 7 6 - 20 mg/dL   Creatinine, Ser 0.91 0.57 - 1.00 mg/dL   eGFR 83 >59 mL/min/1.73   BUN/Creatinine Ratio 8 (L) 9 - 23   Sodium 139 134 - 144 mmol/L   Potassium 4.7 3.5 - 5.2 mmol/L   Chloride 99 96 - 106 mmol/L   CO2 18 (L) 20 - 29 mmol/L   Calcium 9.3 8.7 - 10.2 mg/dL   Total Protein 7.5 6.0 - 8.5 g/dL   Albumin 4.4 3.8 - 4.8 g/dL  Globulin, Total 3.1 1.5 - 4.5 g/dL   Albumin/Globulin Ratio 1.4 1.2 - 2.2   Bilirubin Total 0.2 0.0 - 1.2 mg/dL   Alkaline Phosphatase 79 44 - 121 IU/L   AST 9 0 - 40 IU/L   ALT 8 0 - 32 IU/L  Iron, TIBC and Ferritin Panel   Collection Time: 06/21/21 11:36 AM  Result Value Ref Range   Total Iron Binding Capacity 463 (H) 250 - 450 ug/dL   UIBC 453 (H) 131 - 425 ug/dL   Iron 10 (L) 27 - 159 ug/dL   Iron Saturation 2 (LL) 15 - 55 %   Ferritin 18 15 - 150 ng/mL     Imaging Studies: US PELVIC COMPLETE WITH TRANSVAGINAL CLINICAL DATA:  Fibroids, female infertility, LMP 04/03/2021  EXAM: TRANSABDOMINAL AND TRANSVAGINAL ULTRASOUND OF PELVIS  TECHNIQUE: Both transabdominal and transvaginal ultrasound examinations of the pelvis were performed. Transabdominal technique was performed for global imaging of the pelvis including uterus, ovaries, adnexal regions, and pelvic cul-de-sac. It was necessary to proceed with endovaginal exam following the transabdominal exam to visualize the endometrium and ovaries.  COMPARISON:  06/17/2019  FINDINGS: Uterus  Measurements: 12.4 x 7.5 x 9.7 cm = volume: 474 mL. Anteverted. Enlarged, heterogeneous and nodular containing multiple masses consistent with leiomyomata. Index lesions include a 3.9 cm posterior RIGHT subserosal leiomyoma, 5.1 cm LEFT lateral intramural leiomyoma, and a fundal 6.7 cm subserosal leiomyoma. Some of the smaller  leiomyomata appear to extend submucosal.  Endometrium  Thickness: 5 mm. Suboptimally visualized due to distortion by multiple leiomyomata. No obvious endometrial fluid.  Right ovary  Measurements: 4.4 x 3.4 x 4.7 cm = volume: 36 mL. Poorly visualized due to shadowing from uterus. No gross mass.  Left ovary  Measurements: 4.3 x 3.0 x 3.6 cm = volume: 25 mL. Normal morphology without mass  Other findings  No free pelvic fluid.  No adnexal masses.  IMPRESSION: Enlarged uterus containing multiple uterine leiomyomata, some of which extend submucosal.  Remainder of exam unremarkable.  Electronically Signed   By: Lavonia Dana M.D.   On: 04/06/2021 15:41   Assessment:    1. Preoperative exam for gynecologic surgery   2. History of anemia   3. Intramural, submucous, and subserous leiomyoma of uterus   4. Infertility, female   53. Menorrhagia with regular cycle   6. History of myomectomy      Plan:   - Counseling: Procedure, risks, reasons, benefits and complications (including injury to bowel, bladder, major blood vessel, ureter, bleeding, possibility of transfusion, infection, or fistula formation) reviewed in detail. Likelihood of success in alleviating the patient's condition was discussed. Routine postoperative instructions will be reviewed with the patient and her family in detail after surgery.  The patient concurred with the proposed plan, giving informed written consent for the surgery.   - Preop testing reviewed, patient with history of anemia, currently Hgb 6.4  Will schedule patient for outpatient blood transfusion. . - Instructions reviewed, including NPO after midnight. - Will manage menorrhagia with Aygestin.  - For further management of infertility based on operative findings. Partner with low sperm volume on recent semen analysis, but otherwise normal.    Rubie Maid, MD [facility]

## 2021-06-24 NOTE — H&P (View-Only) (Signed)
[facility] [address] [place], [place]  [address] Phone:  [phone]   Fax:  [phone]   GYNECOLOGY PREOPERATIVE HISTORY AND PHYSICAL   Subjective:  Katie Medina is a 34 y.o. G0P0 here for surgical management of fibroid uterus, menorrhagia, anemia, and primary infertility.  Of note patient has a history of prior laparotomy and robotic myomectomy in the past (notes that the surgery was terminated early due concerns for excessive bleeding from removal of large fibroids and no blood products available in the hospital; waited 6 weeks later to have surgery performed robotically at another facility). She reports that her cycles are heavy, but have been more regular since her last surgery. Katie Medina currently wears Depend diapers (changes 3 times daily, while also using a pad or tampon). She has a history of anemia, usually treating with iron supplementation.   Proposed surgery: Robotic myomectomy, possible hysteroscopic hysterectomy    Pertinent Gynecological History: Patient's last menstrual period was 05/26/2021. Contraception: none Last pap: 1-2 years ago, normal per patient.    Past Medical History:  Diagnosis Date   Anemia    otc iron supplement   AV block, 1st degree    Fibroid    GERD (gastroesophageal reflux disease)    otc reflux reducer prn-has not used in last month-assoc with foods   Infection    UTI    Past Surgical History:  Procedure Laterality Date   LAPAROTOMY  07/17/2012   Procedure: EXPLORATORY LAPAROTOMY;  Surgeon: Avel Sensor, MD;  Location: [facility];  Service: Gynecology;  Laterality: N/A;   MOUTH SURGERY     MYOMECTOMY  08/2012    OB History  Gravida Para Term Preterm AB Living  0            SAB IAB Ectopic Multiple Live Births               Family History  Problem Relation Age of Onset   Diabetes Father    Asthma Brother    Diabetes Maternal Grandmother    Hypertension Maternal Grandfather    Cancer  Maternal Grandfather        prostate   Vision loss Maternal Grandfather    HIV/AIDS Mother    Diabetes Brother    Diabetes Brother    Diabetes Paternal Grandmother    Hearing loss Neg Hx     Social History   Socioeconomic History   Marital status: Married    Spouse name: Not on file   Number of children: Not on file   Years of education: Not on file   Highest education level: Not on file  Occupational History   Not on file  Tobacco Use   Smoking status: Never   Smokeless tobacco: Never  Vaping Use   Vaping Use: Never used  Substance and Sexual Activity   Alcohol use: Yes    Alcohol/week: 2.0 - 4.0 standard drinks    Types: 1 - 2 Glasses of wine, 1 - 2 Shots of liquor per week    Comment: usally specilal occasions   Drug use: No   Sexual activity: Yes    Birth control/protection: None  Other Topics Concern   Not on file  Social History Narrative   Entered 03/2015:   Lives with her spouse. Has no children   Works Full Time--[employer] at [employer] Strain: Not on [employer] Insecurity: Not on [employer]  Needs: Not on file  Physical Activity: Not on file  Stress: Not on file  Social Connections: Not on file  Intimate Partner Violence: Not on file    Current Outpatient Medications on File Prior to Visit  Medication Sig Dispense Refill   cetirizine (ZYRTEC) 10 MG chewable tablet Chew 1 tablet (10 mg total) by mouth daily. (Patient not taking: No sig reported) 30 tablet 0   fluticasone (FLONASE) 50 MCG/ACT nasal spray Place 1 spray into both nostrils daily for 14 days. (Patient taking differently: Place 1 spray into both nostrils daily as needed for allergies.) 16 g 0   iron polysaccharides (NU-IRON) 150 MG capsule Take 1 capsule (150 mg total) by mouth 2 (two) times daily. (Patient not taking: Reported on 06/23/2021) 60 capsule 11   No current facility-administered medications on file prior to visit.     Allergies  Allergen Reactions   Penicillins     Unknown reaction   Percocet [Oxycodone-Acetaminophen] Other (See Comments)    Causes back pain.      Review of Systems Constitutional: No recent fever/chills/sweats Respiratory: No recent cough/bronchitis Cardiovascular: No chest pain Gastrointestinal: No recent nausea/vomiting/diarrhea Genitourinary: No UTI symptoms Hematologic/lymphatic:No history of coagulopathy or recent blood thinner use    Objective:   Blood pressure 117/78, pulse 91, height '5\' 4"'  (1.626 m), weight 223 lb 4.8 oz (101.3 kg), last menstrual period 05/26/2021. CONSTITUTIONAL: Well-developed, well-nourished female in no acute distress.  HENT:  Normocephalic, atraumatic, External right and left ear normal. Oropharynx is clear and moist EYES: Conjunctivae and EOM are normal. Pupils are equal, round, and reactive to light. No scleral icterus.  NECK: Normal range of motion, supple, no masses SKIN: Skin is warm and dry. No rash noted. Not diaphoretic. No erythema. No pallor. NEUROLOGIC: Alert and oriented to person, place, and time. Normal reflexes, muscle tone coordination. No cranial nerve deficit noted. PSYCHIATRIC: Normal mood and affect. Normal behavior. Normal judgment and thought content. CARDIOVASCULAR: Normal heart rate noted, regular rhythm RESPIRATORY: Effort and breath sounds normal, no problems with respiration noted ABDOMEN: Soft, nontender, nondistended. PELVIC: Deferred MUSCULOSKELETAL: Normal range of motion. No edema and no tenderness. 2+ distal pulses.    Labs: Results for orders placed or performed in visit on 06/21/21 (from the past 336 hour(s))  CBC   Collection Time: 06/21/21 11:36 AM  Result Value Ref Range   WBC 7.2 3.4 - 10.8 x10E3/uL   RBC 4.22 3.77 - 5.28 x10E6/uL   Hemoglobin 6.4 (LL) 11.1 - 15.9 g/dL   Hematocrit 25.3 (L) 34.0 - 46.6 %   MCV 60 (L) 79 - 97 fL   MCH 15.2 (L) 26.6 - 33.0 pg   MCHC 25.3 (L) 31.5 - 35.7 g/dL    RDW 20.5 (H) 11.7 - 15.4 %   Platelets 412 150 - 450 x10E3/uL  Comp Met (CMET)   Collection Time: 06/21/21 11:36 AM  Result Value Ref Range   Glucose 65 65 - 99 mg/dL   BUN 7 6 - 20 mg/dL   Creatinine, Ser 0.91 0.57 - 1.00 mg/dL   eGFR 83 >59 mL/min/1.73   BUN/Creatinine Ratio 8 (L) 9 - 23   Sodium 139 134 - 144 mmol/L   Potassium 4.7 3.5 - 5.2 mmol/L   Chloride 99 96 - 106 mmol/L   CO2 18 (L) 20 - 29 mmol/L   Calcium 9.3 8.7 - 10.2 mg/dL   Total Protein 7.5 6.0 - 8.5 g/dL   Albumin 4.4 3.8 - 4.8 g/dL  Globulin, Total 3.1 1.5 - 4.5 g/dL   Albumin/Globulin Ratio 1.4 1.2 - 2.2   Bilirubin Total 0.2 0.0 - 1.2 mg/dL   Alkaline Phosphatase 79 44 - 121 IU/L   AST 9 0 - 40 IU/L   ALT 8 0 - 32 IU/L  Iron, TIBC and Ferritin Panel   Collection Time: 06/21/21 11:36 AM  Result Value Ref Range   Total Iron Binding Capacity 463 (H) 250 - 450 ug/dL   UIBC 453 (H) 131 - 425 ug/dL   Iron 10 (L) 27 - 159 ug/dL   Iron Saturation 2 (LL) 15 - 55 %   Ferritin 18 15 - 150 ng/mL     Imaging Studies: US PELVIC COMPLETE WITH TRANSVAGINAL CLINICAL DATA:  Fibroids, female infertility, LMP 04/03/2021  EXAM: TRANSABDOMINAL AND TRANSVAGINAL ULTRASOUND OF PELVIS  TECHNIQUE: Both transabdominal and transvaginal ultrasound examinations of the pelvis were performed. Transabdominal technique was performed for global imaging of the pelvis including uterus, ovaries, adnexal regions, and pelvic cul-de-sac. It was necessary to proceed with endovaginal exam following the transabdominal exam to visualize the endometrium and ovaries.  COMPARISON:  06/17/2019  FINDINGS: Uterus  Measurements: 12.4 x 7.5 x 9.7 cm = volume: 474 mL. Anteverted. Enlarged, heterogeneous and nodular containing multiple masses consistent with leiomyomata. Index lesions include a 3.9 cm posterior RIGHT subserosal leiomyoma, 5.1 cm LEFT lateral intramural leiomyoma, and a fundal 6.7 cm subserosal leiomyoma. Some of the smaller  leiomyomata appear to extend submucosal.  Endometrium  Thickness: 5 mm. Suboptimally visualized due to distortion by multiple leiomyomata. No obvious endometrial fluid.  Right ovary  Measurements: 4.4 x 3.4 x 4.7 cm = volume: 36 mL. Poorly visualized due to shadowing from uterus. No gross mass.  Left ovary  Measurements: 4.3 x 3.0 x 3.6 cm = volume: 25 mL. Normal morphology without mass  Other findings  No free pelvic fluid.  No adnexal masses.  IMPRESSION: Enlarged uterus containing multiple uterine leiomyomata, some of which extend submucosal.  Remainder of exam unremarkable.  Electronically Signed   By: Lavonia Dana M.D.   On: 04/06/2021 15:41   Assessment:    1. Preoperative exam for gynecologic surgery   2. History of anemia   3. Intramural, submucous, and subserous leiomyoma of uterus   4. Infertility, female   75. Menorrhagia with regular cycle   6. History of myomectomy      Plan:   - Counseling: Procedure, risks, reasons, benefits and complications (including injury to bowel, bladder, major blood vessel, ureter, bleeding, possibility of transfusion, infection, or fistula formation) reviewed in detail. Likelihood of success in alleviating the patient's condition was discussed. Routine postoperative instructions will be reviewed with the patient and her family in detail after surgery.  The patient concurred with the proposed plan, giving informed written consent for the surgery.   - Preop testing reviewed, patient with history of anemia, currently Hgb 6.4  Will schedule patient for outpatient blood transfusion. . - Instructions reviewed, including NPO after midnight. - Will manage menorrhagia with Aygestin.  - For further management of infertility based on operative findings. Partner with low sperm volume on recent semen analysis, but otherwise normal.    Rubie Maid, MD [facility]

## 2021-06-27 ENCOUNTER — Encounter: Payer: Self-pay | Admitting: *Deleted

## 2021-06-29 ENCOUNTER — Other Ambulatory Visit: Payer: Self-pay

## 2021-06-29 ENCOUNTER — Encounter
Admission: RE | Admit: 2021-06-29 | Discharge: 2021-06-29 | Disposition: A | Discharge status: Home / Self Care | Payer: 59 | Source: Ambulatory Visit | Attending: Obstetrics and Gynecology | Admitting: Obstetrics and Gynecology

## 2021-06-29 NOTE — Progress Notes (Signed)
Perioperative Services Pre-Admission/Anesthesia Testing   Date: 06/29/21 Name: BAYLEI SIEBELS MRN:   [id number]  Re: Consideration of preoperative prophylactic antibiotic change   Request sent to: Rubie Maid, MD (routed and/or faxed via [facility])  Planned Surgical Procedure(s):    Case: [id number] Date/Time: 07/03/21 1330   Procedures:      XI ROBOTIC ASSISTED MYOMECTOMY     CHROMOPERTUBATION     DILATATION & CURETTAGE/HYSTEROSCOPY WITH [place]   Anesthesia type: General   Pre-op diagnosis: Uterine Fibroids, Anemia, Heavy Menses, History of Infertility   Location: [facility] OR ROOM 04 / [facility]   Surgeons: Rubie Maid, MD     Notes: Patient has a documented allergy to PCN  The actual reaction to PCN is unknown/  Received cephalosporin with no documented complications CEFAZOLIN received on 07/17/2012  Screened as appropriate for cephalosporin use during medication reconciliation No immediate angioedema, dysphagia, SOB, anaphylaxis symptoms. No severe rash involving mucous membranes or skin necrosis. No hospital admissions related to side effects of PCN/cephalosporin use.  No documented reaction to PCN or cephalosporin in the last 10 years.  Request:  As an evidence based approach to reducing the rate of incidence for post-operative SSI and the development of MDROs, could an agent with narrower coverage for preoperative prophylaxis in this patient's upcoming surgical course be considered?   Currently ordered preoperative prophylactic ABX: clindamycin and gentamicin .   Specifically requesting change to cephalosporin (CEFAZOLIN).   Please communicate decision with me and I will change the orders in Epic as per your direction.   Things to consider: Many patients report that they were "allergic" to PCN earlier in life, however this does not translate into a true lifelong allergy. Patients can lose sensitivity to specific IgE antibodies over time if PCN is  avoided (Kleris & Lugar, 2019).  Up to 10% of the adult population and 15% of hospitalized patients report an allergy to PCN, however clinical studies suggest that 90% of those reporting an allergy can tolerate PCN antibiotics (Kleris & Lugar, 2019).  Cross-sensitivity between PCN and cephalosporins has been documented as being as high as 10%, however this estimation included data believed to have been collected in a setting where there was contamination. Newer data suggests that the prevalence of cross-sensitivity between PCN and cephalosporins is actually estimated to be closer to 1% (Hermanides et al., 2018).   Patients labeled as PCN allergic, whether they are truly allergic or not, have been found to have inferior outcomes in terms of rates of serious infection, and these patients tend to have longer hospital stays (Manson, 2019).  Treatment related secondary infections, such as Clostridioides difficile, have been linked to the improper use of broad spectrum antibiotics in patients improperly labeled as PCN allergic (Kleris & Lugar, 2019).  Anaphylaxis from cephalosporins is rare and the evidence suggests that there is no increased risk of an anaphylactic type reaction when cephalosporins are used in a PCN allergic patient (Pichichero, 2006).  Citations: Hermanides J, Lemkes BA, Prins Pearla Dubonnet MW, Terreehorst I. Presumed ?-Lactam Allergy and Cross-reactivity in the Operating Theater: A Practical Approach. Anesthesiology. 2018 Aug;129(2):335-342. doi: 10.1097/ALN.0000000000002252. PMID: 95638756.  Kleris, Campton., & Lugar, P. L. (2019). Things We Do For No Reason: Failing to Question a Penicillin Allergy History. Journal of hospital medicine, 14(10), [phone]. Advance online publication. https://www.wallace-middleton.info/  Pichichero, M. E. (2006). Cephalosporins can be prescribed safely for penicillin-allergic patients. Journal of family medicine, 55(2), 106-112. Accessed:  https://cdn.mdedge.com/files/s7fs-public/Document/September-2017/5502JFP_AppliedEvidence1.pdf   Honor Loh,  MSN, APRN, FNP-C, [place] Nurse Practitioner FAX: [phone] 06/29/21 3:06 PM

## 2021-06-29 NOTE — Patient Instructions (Signed)
Your procedure is scheduled on: 07/03/21 Report to [facility]. To find out your arrival time please call [phone] between 1PM - 3PM on 06/30/21.  Remember: Instructions that are not followed completely may result in serious medical risk, up to and including death, or upon the discretion of your surgeon and anesthesiologist your surgery may need to be rescheduled.     _X__ 1. Do not eat food after midnight the night before your procedure.                 No gum chewing or hard candies. You may drink clear liquids up to 2 hours                 before you are scheduled to arrive for your surgery- DO not drink clear                 liquids within 2 hours of the start of your surgery.                 Clear Liquids include:  water, apple juice without pulp, clear carbohydrate                 drink such as Clearfast or Gatorade, Black Coffee or Tea (Do not add                 anything to coffee or tea). Diabetics water only  You have been given an Ensure "clear" Pre Surgery drink. You will need to drink this 2 hours before arriving to the hospital    __X__2.  On the morning of surgery brush your teeth with toothpaste and water, you                 may rinse your mouth with mouthwash if you wish.  Do not swallow any              toothpaste of mouthwash.     _X__ 3.  No Alcohol for 24 hours before or after surgery.   _X__ 4.  Do Not Smoke or use e-cigarettes For 24 Hours Prior to Your Surgery.                 Do not use any chewable tobacco products for at least 6 hours prior to                 surgery.  ____  5.  Bring all medications with you on the day of surgery if instructed.   __X__  6.  Notify your doctor if there is any change in your medical condition      (cold, fever, infections).     Do not wear jewelry, make-up, hairpins, clips or nail polish. Do not wear lotions, powders, or perfumes. Shower the morning of surgery Do not  shave 48 hours prior to surgery. Men may shave face and neck. Do not bring valuables to the hospital.    [facility] is not responsible for any belongings or valuables.  Contacts, dentures/partials or body piercings may not be worn into surgery. Bring a case for your contacts, glasses or hearing aids, a denture cup will be supplied. Leave your suitcase in the car. After surgery it may be brought to your room. For patients admitted to the hospital, discharge time is determined by your treatment team.   Patients discharged the day of surgery will not be allowed to drive home.   Please  read over the following fact sheets that you were given:     __X__ Take these medicines the morning of surgery with A SIP OF WATER:    1. none  2.   3.   4.  5.  6.  ____ Fleet Enema (as directed)   ____ Use CHG Soap/SAGE wipes as directed  ____ Use inhalers on the day of surgery  ____ Stop metformin/Janumet/Farxiga 2 days prior to surgery    ____ Take 1/2 of usual insulin dose the night before surgery. No insulin the morning          of surgery.   ____ Stop Blood Thinners Coumadin/Plavix/Xarelto/Pleta/Pradaxa/Eliquis/Effient/Aspirin  on   Or contact your Surgeon, Cardiologist or Medical Doctor regarding  ability to stop your blood thinners  __X__ Stop Anti-inflammatories 7 days before surgery such as Advil, Ibuprofen, Motrin,  BC or Goodies Powder, Naprosyn, Naproxen, Aleve, Aspirin    __X__ Stop all herbal supplements, fish oil or vitamin E until after surgery.    ____ Bring C-Pap to the hospital.

## 2021-06-30 ENCOUNTER — Other Ambulatory Visit
Admission: RE | Admit: 2021-06-30 | Discharge: 2021-06-30 | Disposition: A | Discharge status: Home / Self Care | Payer: 59 | Source: Ambulatory Visit | Attending: Obstetrics and Gynecology | Admitting: Obstetrics and Gynecology

## 2021-06-30 DIAGNOSIS — Z01818 Encounter for other preprocedural examination: Secondary | ICD-10-CM | POA: Insufficient documentation

## 2021-06-30 LAB — CBC
HCT: 32.6 % — ABNORMAL LOW (ref 36.0–46.0)
Hemoglobin: 9.6 g/dL — ABNORMAL LOW (ref 12.0–15.0)
MCH: 20.3 pg — ABNORMAL LOW (ref 26.0–34.0)
MCHC: 29.4 g/dL — ABNORMAL LOW (ref 30.0–36.0)
MCV: 68.8 fL — ABNORMAL LOW (ref 80.0–100.0)
Platelets: 482 10*3/uL — ABNORMAL HIGH (ref 150–400)
RBC: 4.74 MIL/uL (ref 3.87–5.11)
WBC: 9 10*3/uL (ref 4.0–10.5)
nRBC: 0 % (ref 0.0–0.2)

## 2021-06-30 LAB — TYPE AND SCREEN
ABO/RH(D): B POS
Antibody Screen: NEGATIVE

## 2021-07-03 ENCOUNTER — Encounter: Payer: Self-pay | Admitting: Obstetrics and Gynecology

## 2021-07-03 ENCOUNTER — Encounter
Admission: RE | Disposition: A | Discharge status: Home / Self Care | Payer: Self-pay | Source: Home / Self Care | Attending: Obstetrics and Gynecology

## 2021-07-03 ENCOUNTER — Other Ambulatory Visit: Payer: Self-pay

## 2021-07-03 ENCOUNTER — Ambulatory Visit
Admission: RE | Admit: 2021-07-03 | Discharge: 2021-07-03 | Disposition: A | Discharge status: Home / Self Care | Payer: 59 | Attending: Obstetrics and Gynecology | Admitting: Obstetrics and Gynecology

## 2021-07-03 ENCOUNTER — Ambulatory Visit: Payer: 59 | Admitting: Anesthesiology

## 2021-07-03 DIAGNOSIS — N838 Other noninflammatory disorders of ovary, fallopian tube and broad ligament: Secondary | ICD-10-CM | POA: Diagnosis not present

## 2021-07-03 DIAGNOSIS — Z825 Family history of asthma and other chronic lower respiratory diseases: Secondary | ICD-10-CM | POA: Diagnosis not present

## 2021-07-03 DIAGNOSIS — Z9889 Other specified postprocedural states: Secondary | ICD-10-CM

## 2021-07-03 DIAGNOSIS — D252 Subserosal leiomyoma of uterus: Secondary | ICD-10-CM

## 2021-07-03 DIAGNOSIS — D649 Anemia, unspecified: Secondary | ICD-10-CM | POA: Diagnosis not present

## 2021-07-03 DIAGNOSIS — D259 Leiomyoma of uterus, unspecified: Secondary | ICD-10-CM | POA: Diagnosis present

## 2021-07-03 DIAGNOSIS — D251 Intramural leiomyoma of uterus: Secondary | ICD-10-CM

## 2021-07-03 DIAGNOSIS — N979 Female infertility, unspecified: Secondary | ICD-10-CM | POA: Diagnosis not present

## 2021-07-03 DIAGNOSIS — D219 Benign neoplasm of connective and other soft tissue, unspecified: Secondary | ICD-10-CM

## 2021-07-03 DIAGNOSIS — N736 Female pelvic peritoneal adhesions (postinfective): Secondary | ICD-10-CM | POA: Insufficient documentation

## 2021-07-03 DIAGNOSIS — Z880 Allergy status to penicillin: Secondary | ICD-10-CM | POA: Diagnosis not present

## 2021-07-03 DIAGNOSIS — Z8249 Family history of ischemic heart disease and other diseases of the circulatory system: Secondary | ICD-10-CM | POA: Diagnosis not present

## 2021-07-03 DIAGNOSIS — Z886 Allergy status to analgesic agent status: Secondary | ICD-10-CM | POA: Insufficient documentation

## 2021-07-03 DIAGNOSIS — N920 Excessive and frequent menstruation with regular cycle: Secondary | ICD-10-CM | POA: Diagnosis not present

## 2021-07-03 DIAGNOSIS — D250 Submucous leiomyoma of uterus: Secondary | ICD-10-CM | POA: Diagnosis not present

## 2021-07-03 DIAGNOSIS — Z885 Allergy status to narcotic agent status: Secondary | ICD-10-CM | POA: Diagnosis not present

## 2021-07-03 DIAGNOSIS — Z79899 Other long term (current) drug therapy: Secondary | ICD-10-CM | POA: Diagnosis not present

## 2021-07-03 DIAGNOSIS — D500 Iron deficiency anemia secondary to blood loss (chronic): Secondary | ICD-10-CM

## 2021-07-03 DIAGNOSIS — E282 Polycystic ovarian syndrome: Secondary | ICD-10-CM

## 2021-07-03 DIAGNOSIS — Z833 Family history of diabetes mellitus: Secondary | ICD-10-CM | POA: Insufficient documentation

## 2021-07-03 HISTORY — PX: CHROMOPERTUBATION: SHX6288

## 2021-07-03 HISTORY — PX: DILATATION & CURETTAGE/HYSTEROSCOPY WITH MYOSURE: SHX6511

## 2021-07-03 HISTORY — PX: ROBOT ASSISTED MYOMECTOMY: SHX5142

## 2021-07-03 LAB — POCT PREGNANCY, URINE: Preg Test, Ur: NEGATIVE

## 2021-07-03 SURGERY — MYOMECTOMY, ROBOT-ASSISTED
Anesthesia: General

## 2021-07-03 MED ORDER — CHLORHEXIDINE GLUCONATE 0.12 % MT SOLN
OROMUCOSAL | Status: AC
  Filled 2021-07-03: qty 15

## 2021-07-03 MED ORDER — HYDROMORPHONE HCL 1 MG/ML IJ SOLN: 0.5000 mg | INTRAMUSCULAR | Status: DC | PRN

## 2021-07-03 MED ORDER — FENTANYL CITRATE (PF) 100 MCG/2ML IJ SOLN
INTRAMUSCULAR | Status: DC | PRN
  Administered 2021-07-03 (×6): 50 ug via INTRAVENOUS

## 2021-07-03 MED ORDER — SODIUM CHLORIDE (PF) 0.9 % IJ SOLN
INTRAMUSCULAR | Status: AC
  Filled 2021-07-03: qty 50

## 2021-07-03 MED ORDER — FENTANYL CITRATE (PF) 100 MCG/2ML IJ SOLN
25.0000 ug | INTRAMUSCULAR | Status: DC | PRN
  Administered 2021-07-03: 50 ug via INTRAVENOUS
  Administered 2021-07-03: 25 ug via INTRAVENOUS

## 2021-07-03 MED ORDER — GABAPENTIN 300 MG PO CAPS
ORAL_CAPSULE | ORAL | Status: AC
  Administered 2021-07-03: 300 mg via ORAL
  Filled 2021-07-03: qty 1

## 2021-07-03 MED ORDER — SIMETHICONE 80 MG PO CHEW: 80.0000 mg | CHEWABLE_TABLET | Freq: Four times a day (QID) | ORAL | 2 refills | Status: DC | PRN

## 2021-07-03 MED ORDER — BUPIVACAINE HCL (PF) 0.5 % IJ SOLN
INTRAMUSCULAR | Status: DC | PRN
  Administered 2021-07-03: 30 mL

## 2021-07-03 MED ORDER — POVIDONE-IODINE 10 % EX SWAB
2.0000 "application " | Freq: Once | CUTANEOUS | Status: AC
  Administered 2021-07-03: 2 via TOPICAL

## 2021-07-03 MED ORDER — FENTANYL CITRATE (PF) 250 MCG/5ML IJ SOLN
INTRAMUSCULAR | Status: AC
  Filled 2021-07-03: qty 5

## 2021-07-03 MED ORDER — GENTAMICIN SULFATE 40 MG/ML IJ SOLN
5.0000 mg/kg | INTRAVENOUS | Status: DC
  Filled 2021-07-03: qty 12.75

## 2021-07-03 MED ORDER — SUCCINYLCHOLINE CHLORIDE 20 MG/ML IJ SOLN
INTRAMUSCULAR | Status: DC | PRN
  Administered 2021-07-03: 120 mg via INTRAVENOUS

## 2021-07-03 MED ORDER — ONDANSETRON HCL 4 MG/2ML IJ SOLN
4.0000 mg | Freq: Once | INTRAMUSCULAR | Status: AC | PRN
  Administered 2021-07-03: 4 mg via INTRAVENOUS

## 2021-07-03 MED ORDER — GLYCOPYRROLATE 0.2 MG/ML IJ SOLN
INTRAMUSCULAR | Status: DC | PRN
  Administered 2021-07-03: .2 mg via INTRAVENOUS

## 2021-07-03 MED ORDER — FENTANYL CITRATE (PF) 100 MCG/2ML IJ SOLN
INTRAMUSCULAR | Status: AC
  Filled 2021-07-03: qty 2

## 2021-07-03 MED ORDER — PHENYLEPHRINE HCL (PRESSORS) 10 MG/ML IV SOLN
INTRAVENOUS | Status: DC | PRN
  Administered 2021-07-03 (×4): 100 ug via INTRAVENOUS

## 2021-07-03 MED ORDER — SUGAMMADEX SODIUM 200 MG/2ML IV SOLN
INTRAVENOUS | Status: DC | PRN
  Administered 2021-07-03: 200 mg via INTRAVENOUS

## 2021-07-03 MED ORDER — LIDOCAINE HCL (CARDIAC) PF 100 MG/5ML IV SOSY
PREFILLED_SYRINGE | INTRAVENOUS | Status: DC | PRN
  Administered 2021-07-03: 100 mg via INTRAVENOUS

## 2021-07-03 MED ORDER — HYDROMORPHONE HCL 2 MG PO TABS: 2.0000 mg | ORAL_TABLET | ORAL | 0 refills | Status: AC | PRN

## 2021-07-03 MED ORDER — PROPOFOL 10 MG/ML IV BOLUS
INTRAVENOUS | Status: AC
  Filled 2021-07-03: qty 20

## 2021-07-03 MED ORDER — MIDAZOLAM HCL 2 MG/2ML IJ SOLN
INTRAMUSCULAR | Status: AC
  Filled 2021-07-03: qty 2

## 2021-07-03 MED ORDER — VASOPRESSIN 20 UNIT/ML IV SOLN
INTRAVENOUS | Status: DC | PRN
  Administered 2021-07-03: 15 mL via INTRAMUSCULAR

## 2021-07-03 MED ORDER — METHYLENE BLUE 0.5 % INJ SOLN
INTRAVENOUS | Status: DC | PRN
  Administered 2021-07-03: 3 mL via SUBMUCOSAL

## 2021-07-03 MED ORDER — BUPIVACAINE HCL (PF) 0.5 % IJ SOLN
INTRAMUSCULAR | Status: AC
  Filled 2021-07-03: qty 30

## 2021-07-03 MED ORDER — ORAL CARE MOUTH RINSE: 15.0000 mL | Freq: Once | OROMUCOSAL | Status: AC

## 2021-07-03 MED ORDER — CHLORHEXIDINE GLUCONATE 0.12 % MT SOLN
15.0000 mL | Freq: Once | OROMUCOSAL | Status: AC
  Administered 2021-07-03: 15 mL via OROMUCOSAL

## 2021-07-03 MED ORDER — PROPOFOL 10 MG/ML IV BOLUS
INTRAVENOUS | Status: DC | PRN
  Administered 2021-07-03: 200 mg via INTRAVENOUS

## 2021-07-03 MED ORDER — VECURONIUM BROMIDE 10 MG IV SOLR
INTRAVENOUS | Status: DC | PRN
  Administered 2021-07-03: 5 mg via INTRAVENOUS

## 2021-07-03 MED ORDER — BUPIVACAINE LIPOSOME 1.3 % IJ SUSP
INTRAMUSCULAR | Status: DC | PRN
  Administered 2021-07-03: 20 mL

## 2021-07-03 MED ORDER — ONDANSETRON HCL 4 MG/2ML IJ SOLN
INTRAMUSCULAR | Status: DC | PRN
  Administered 2021-07-03 (×2): 4 mg via INTRAVENOUS

## 2021-07-03 MED ORDER — CEFAZOLIN SODIUM-DEXTROSE 2-4 GM/100ML-% IV SOLN
2.0000 g | Freq: Once | INTRAVENOUS | Status: AC
  Administered 2021-07-03 (×2): 2 g via INTRAVENOUS

## 2021-07-03 MED ORDER — MIDAZOLAM HCL 2 MG/2ML IJ SOLN
INTRAMUSCULAR | Status: DC | PRN
  Administered 2021-07-03: 2 mg via INTRAVENOUS

## 2021-07-03 MED ORDER — DEXAMETHASONE SODIUM PHOSPHATE 10 MG/ML IJ SOLN
INTRAMUSCULAR | Status: DC | PRN
  Administered 2021-07-03: 10 mg via INTRAVENOUS

## 2021-07-03 MED ORDER — 0.9 % SODIUM CHLORIDE (POUR BTL) OPTIME
TOPICAL | Status: DC | PRN
  Administered 2021-07-03: 500 mL

## 2021-07-03 MED ORDER — ACETAMINOPHEN 500 MG PO TABS
ORAL_TABLET | ORAL | Status: AC
  Administered 2021-07-03: 1000 mg via ORAL
  Filled 2021-07-03: qty 2

## 2021-07-03 MED ORDER — CLINDAMYCIN PHOSPHATE 900 MG/50ML IV SOLN: 900.0000 mg | INTRAVENOUS | Status: DC

## 2021-07-03 MED ORDER — ONDANSETRON HCL 4 MG/2ML IJ SOLN
INTRAMUSCULAR | Status: AC
  Filled 2021-07-03: qty 2

## 2021-07-03 MED ORDER — IBUPROFEN 600 MG PO TABS: 600.0000 mg | ORAL_TABLET | Freq: Four times a day (QID) | ORAL | 3 refills | Status: AC | PRN

## 2021-07-03 MED ORDER — DOCUSATE SODIUM 100 MG PO CAPS: 100.0000 mg | ORAL_CAPSULE | Freq: Two times a day (BID) | ORAL | 2 refills | Status: AC | PRN

## 2021-07-03 MED ORDER — CEFAZOLIN SODIUM-DEXTROSE 2-4 GM/100ML-% IV SOLN
INTRAVENOUS | Status: AC
  Filled 2021-07-03: qty 100

## 2021-07-03 MED ORDER — BUPIVACAINE LIPOSOME 1.3 % IJ SUSP
INTRAMUSCULAR | Status: AC
  Filled 2021-07-03: qty 20

## 2021-07-03 MED ORDER — GABAPENTIN 300 MG PO CAPS: 300.0000 mg | ORAL_CAPSULE | ORAL | Status: AC

## 2021-07-03 MED ORDER — VASOPRESSIN 20 UNIT/ML IV SOLN
INTRAVENOUS | Status: AC
  Filled 2021-07-03: qty 1

## 2021-07-03 MED ORDER — ACETAMINOPHEN 10 MG/ML IV SOLN
INTRAVENOUS | Status: DC | PRN
  Administered 2021-07-03: 1000 mg via INTRAVENOUS

## 2021-07-03 MED ORDER — ACETAMINOPHEN 500 MG PO TABS: 1000.0000 mg | ORAL_TABLET | ORAL | Status: AC

## 2021-07-03 MED ORDER — LACTATED RINGERS IV SOLN: INTRAVENOUS | Status: DC

## 2021-07-03 MED ORDER — CLINDAMYCIN PHOSPHATE 900 MG/50ML IV SOLN
INTRAVENOUS | Status: AC
  Filled 2021-07-03: qty 50

## 2021-07-03 MED ORDER — FAMOTIDINE 20 MG PO TABS: 20.0000 mg | ORAL_TABLET | Freq: Once | ORAL | Status: AC

## 2021-07-03 MED ORDER — ROCURONIUM BROMIDE 100 MG/10ML IV SOLN
INTRAVENOUS | Status: DC | PRN
  Administered 2021-07-03: 50 mg via INTRAVENOUS
  Administered 2021-07-03: 10 mg via INTRAVENOUS
  Administered 2021-07-03: 40 mg via INTRAVENOUS
  Administered 2021-07-03: 20 mg via INTRAVENOUS

## 2021-07-03 MED ORDER — ACETAMINOPHEN 10 MG/ML IV SOLN
INTRAVENOUS | Status: AC
  Filled 2021-07-03: qty 100

## 2021-07-03 MED ORDER — FAMOTIDINE 20 MG PO TABS
ORAL_TABLET | ORAL | Status: AC
  Administered 2021-07-03: 20 mg via ORAL
  Filled 2021-07-03: qty 1

## 2021-07-03 SURGICAL SUPPLY — 95 items
ABLATOR ENDOMETRIAL MYOSURE (ABLATOR) ×2 IMPLANT
ADH SKN CLS APL DERMABOND .7 (GAUZE/BANDAGES/DRESSINGS) ×1
APL PRP STRL LF DISP 70% ISPRP (MISCELLANEOUS) ×1
BACTOSHIELD CHG 4% 4OZ (MISCELLANEOUS) ×1
BAG DRN RND TRDRP ANRFLXCHMBR (UROLOGICAL SUPPLIES) ×1
BAG LAPAROSCOPIC 12 15 PORT 16 (BASKET) ×1 IMPLANT
BAG RETRIEVAL 12/15 (BASKET) ×2
BAG URINE DRAIN 2000ML AR STRL (UROLOGICAL SUPPLIES) ×2 IMPLANT
BLADE SURG SZ11 CARB STEEL (BLADE) ×2 IMPLANT
CANISTER SUC SOCK COL 7IN (MISCELLANEOUS) ×2 IMPLANT
CANNULA CAP OBTURATR AIRSEAL 8 (CAP) IMPLANT
CATH FOLEY 2WAY  5CC 16FR (CATHETERS) ×2
CATH FOLEY 2WAY 5CC 16FR (CATHETERS) ×1
CATH ROBINSON RED A/P 16FR (CATHETERS) ×2 IMPLANT
CATH URTH 16FR FL 2W BLN LF (CATHETERS) ×1 IMPLANT
CHLORAPREP W/TINT 26 (MISCELLANEOUS) ×2 IMPLANT
COVER TIP SHEARS 8 DVNC (MISCELLANEOUS) ×1 IMPLANT
COVER TIP SHEARS 8MM DA VINCI (MISCELLANEOUS) ×2
DEFOGGER SCOPE WARMER CLEARIFY (MISCELLANEOUS) ×2 IMPLANT
DERMABOND ADVANCED (GAUZE/BANDAGES/DRESSINGS) ×1
DERMABOND ADVANCED .7 DNX12 (GAUZE/BANDAGES/DRESSINGS) ×1 IMPLANT
DEVICE MYOSURE LITE (MISCELLANEOUS) IMPLANT
DRAPE 3/4 80X56 (DRAPES) ×2 IMPLANT
DRAPE ARM DVNC X/XI (DISPOSABLE) ×3 IMPLANT
DRAPE COLUMN DVNC XI (DISPOSABLE) ×1 IMPLANT
DRAPE DA VINCI XI ARM (DISPOSABLE) ×6
DRAPE DA VINCI XI COLUMN (DISPOSABLE) ×2
DRAPE GENERAL ENDO 106X123.5 (DRAPES) ×2 IMPLANT
DRAPE ROBOT W/ LEGGING 30X125 (DRAPES) IMPLANT
DRESSING SURGICEL FIBRLLR 1X2 (HEMOSTASIS) IMPLANT
DRSG SURGICEL FIBRILLAR 1X2 (HEMOSTASIS)
ELECT CAUTERY BLADE 6.4 (BLADE) ×2 IMPLANT
ELECT REM PT RETURN 9FT ADLT (ELECTROSURGICAL) ×2
ELECTRODE REM PT RTRN 9FT ADLT (ELECTROSURGICAL) ×1 IMPLANT
GAUZE 4X4 16PLY ~~LOC~~+RFID DBL (SPONGE) ×4 IMPLANT
GLOVE SURG ENC MOIS LTX SZ6.5 (GLOVE) ×16 IMPLANT
GLOVE SURG POLY ORTHO LF SZ7.5 (GLOVE) IMPLANT
GLOVE SURG UNDER LTX SZ7 (GLOVE) ×16 IMPLANT
GOWN STRL REUS W/ TWL LRG LVL3 (GOWN DISPOSABLE) ×4 IMPLANT
GOWN STRL REUS W/TWL LRG LVL3 (GOWN DISPOSABLE) ×8
GRASPER SUT TROCAR 14GX15 (MISCELLANEOUS) ×2 IMPLANT
IRRIGATION STRYKERFLOW (MISCELLANEOUS) ×1 IMPLANT
IRRIGATOR STRYKERFLOW (MISCELLANEOUS) ×2
IV NS 1000ML (IV SOLUTION) ×2
IV NS 1000ML BAXH (IV SOLUTION) ×1 IMPLANT
IV NS IRRIG 3000ML ARTHROMATIC (IV SOLUTION) ×2 IMPLANT
KIT IMAGING PINPOINTPAQ (MISCELLANEOUS) IMPLANT
KIT PINK PAD W/HEAD ARE REST (MISCELLANEOUS) ×2
KIT PINK PAD W/HEAD ARM REST (MISCELLANEOUS) ×1 IMPLANT
KIT PROCEDURE FLUENT (KITS) ×2 IMPLANT
KIT TURNOVER CYSTO (KITS) ×2 IMPLANT
LABEL OR SOLS (LABEL) ×2 IMPLANT
MANIFOLD NEPTUNE II (INSTRUMENTS) ×2 IMPLANT
MANIPULATOR UTERINE 4.5 ZUMI (MISCELLANEOUS) ×2 IMPLANT
NDL HPO THNWL 1X22GA REG BVL (NEEDLE) ×1 IMPLANT
NEEDLE SAFETY 22GX1 (NEEDLE) ×2
NEEDLE VERESS 14GA 120MM (NEEDLE) IMPLANT
NS IRRIG 1000ML POUR BTL (IV SOLUTION) ×2 IMPLANT
PACK DNC HYST (MISCELLANEOUS) ×2 IMPLANT
PACK LAP CHOLECYSTECTOMY (MISCELLANEOUS) ×2 IMPLANT
PAD OB MATERNITY 4.3X12.25 (PERSONAL CARE ITEMS) ×2 IMPLANT
PAD PREP 24X41 OB/GYN DISP (PERSONAL CARE ITEMS) ×2 IMPLANT
PENCIL ELECTRO HAND CTR (MISCELLANEOUS) ×2 IMPLANT
PORT ACCESS TROCAR AIRSEAL 12 (TROCAR) IMPLANT
PORT ACCESS TROCAR AIRSEAL 5M (TROCAR)
RETRACTOR RING XSMALL (MISCELLANEOUS) ×1 IMPLANT
RTRCTR WOUND ALEXIS 13CM XS SH (MISCELLANEOUS) ×2
SCISSORS METZENBAUM CVD 33 (INSTRUMENTS) IMPLANT
SCRUB CHG 4% DYNA-HEX 4OZ (MISCELLANEOUS) ×1 IMPLANT
SEAL CANN UNIV 5-8 DVNC XI (MISCELLANEOUS) ×3 IMPLANT
SEAL ROD LENS SCOPE MYOSURE (ABLATOR) ×2 IMPLANT
SEAL XI 5MM-8MM UNIVERSAL (MISCELLANEOUS) ×6
SEALER VESSEL DA VINCI XI (MISCELLANEOUS)
SEALER VESSEL EXT DVNC XI (MISCELLANEOUS) IMPLANT
SET CYSTO W/LG BORE CLAMP LF (SET/KITS/TRAYS/PACK) IMPLANT
SET TRI-LUMEN FLTR TB AIRSEAL (TUBING) ×2 IMPLANT
SOLUTION ELECTROLUBE (MISCELLANEOUS) ×2 IMPLANT
SURGILUBE 2OZ TUBE FLIPTOP (MISCELLANEOUS) IMPLANT
SUT DVC VLOC 180 0 12IN GS21 (SUTURE)
SUT MNCRL 4-0 (SUTURE) ×2
SUT MNCRL 4-0 27XMFL (SUTURE) ×1
SUT VIC AB 2-0 CT1 27 (SUTURE)
SUT VIC AB 2-0 CT1 TAPERPNT 27 (SUTURE) IMPLANT
SUT VIC AB 3-0 SH 27 (SUTURE) ×2
SUT VIC AB 3-0 SH 27X BRD (SUTURE) ×1 IMPLANT
SUT VICRYL 0 AB UR-6 (SUTURE) ×2 IMPLANT
SUT VLOC 90 S/L VL9 GS22 (SUTURE) ×6 IMPLANT
SUTURE DVC VLC 180 0 12IN GS21 (SUTURE) IMPLANT
SUTURE MNCRL 4-0 27XMF (SUTURE) ×1 IMPLANT
SYR 10ML LL (SYRINGE) ×2 IMPLANT
SYR 50ML LL SCALE MARK (SYRINGE) ×2 IMPLANT
TROCAR ENDO BLADELESS 11MM (ENDOMECHANICALS) ×2 IMPLANT
TROCAR PORT AIRSEAL 12X150 (TUBING) ×2 IMPLANT
TUBING ART PRESS 48 MALE/FEM (TUBING) ×2 IMPLANT
TUBING CONNECTING 10 (TUBING) IMPLANT

## 2021-07-03 NOTE — Transfer of Care (Signed)
Immediate Anesthesia Transfer of Care Note  Patient: Katie Medina  Procedure(s) Performed: XI ROBOTIC ASSISTED MYOMECTOMY CHROMOPERTUBATION DILATATION & CURETTAGE/HYSTEROSCOPY WITH [place]  Patient Location: PACU  Anesthesia Type:General  Level of Consciousness: awake, drowsy and patient cooperative  Airway & Oxygen Therapy: Patient Spontanous Breathing  Post-op Assessment: Report given to RN and Post -op Vital signs reviewed and stable  Post vital signs: Reviewed and stable  Last Vitals:  Vitals Value Taken Time  BP 144/83 07/03/21 1731  Temp    Pulse 80 07/03/21 1734  Resp 16 07/03/21 1734  SpO2 100 % 07/03/21 1734  Vitals shown include unvalidated device data.  Last Pain:  Vitals:   07/03/21 1005  TempSrc: Oral     TEMP: 35.2Y    Complications: No notable events documented.

## 2021-07-03 NOTE — Anesthesia Procedure Notes (Signed)
Procedure Name: Intubation Date/Time: 07/03/2021 12:12 PM Performed by: Nelda Marseille, CRNA Pre-anesthesia Checklist: Patient identified, Patient being monitored, Timeout performed, Emergency Drugs available and Suction available Patient Re-evaluated:Patient Re-evaluated prior to induction Oxygen Delivery Method: Circle system utilized Preoxygenation: Pre-oxygenation with 100% oxygen Induction Type: IV induction Ventilation: Mask ventilation without difficulty Laryngoscope Size: Mac, 3 and McGraph Grade View: Grade I Tube type: Oral Tube size: 7.0 mm Number of attempts: 1 Airway Equipment and Method: Stylet Placement Confirmation: ETT inserted through vocal cords under direct vision, positive ETCO2 and breath sounds checked- equal and bilateral Secured at: 21.5 cm Tube secured with: Tape Dental Injury: Teeth and Oropharynx as per pre-operative assessment

## 2021-07-03 NOTE — Interval H&P Note (Signed)
History and Physical Interval Note:  07/03/2021 11:17 AM  Katie Medina  has presented today for surgery, with the diagnosis of Uterine Fibroids, Anemia, Heavy Menses, History of Infertility.  The various methods of treatment have been discussed with the patient and family. After consideration of risks, benefits and other options for treatment, the patient has consented to  Procedure(s): XI ROBOTIC ASSISTED MYOMECTOMY (N/A) CHROMOPERTUBATION (N/A) [name] (N/A) as a surgical intervention.  Recently received outpatient blood transfusion for management of her anemia  2 weeks ago. The patient's history has been reviewed, patient examined, no change in status, stable for surgery.  I have reviewed the patient's chart and labs.  Questions were answered to the patient's satisfaction.     Rubie Maid, MD [facility]

## 2021-07-03 NOTE — Discharge Instructions (Addendum)
General Gynecological Post-Operative Instructions You may expect to feel dizzy, weak, and drowsy for as long as 24 hours after receiving the medicine that made you sleep (anesthetic).  Do not drive a car, ride a bicycle, participate in physical activities, or take public transportation until you are done taking narcotic pain medicines or as directed by your doctor.  Do not drink alcohol or take tranquilizers.  Do not take medicine that has not been prescribed by your doctor.  Do not sign important papers or make important decisions while on narcotic pain medicines.  Have a responsible person with you.  CARE OF INCISION  Keep incision clean and dry. Take showers instead of baths until your doctor gives you permission to take baths.  Avoid heavy lifting (more than 10 pounds/4.5 kilograms), pushing, or pulling.  Avoid activities that may risk injury to your surgical site.  No sexual intercourse or placement of anything in the vagina for 4 weeks or as instructed by your doctor. If you have tubes coming from the wound site, check with your doctor regarding appropriate care of the tubes. Only take prescription or over-the-counter medicines  for pain, discomfort, or fever as directed by your doctor. Do not take aspirin. It can make you bleed. Take medicines (antibiotics) that kill germs if they are prescribed for you.  Call the office or go to the MAU if:  You feel sick to your stomach (nauseous).  You start to throw up (vomit).  You have trouble eating or drinking.  You have an oral temperature above 101.  You have constipation that is not helped by adjusting diet or increasing fluid intake. Pain medicines are a common cause of constipation.  You have any other concerns. SEEK IMMEDIATE MEDICAL CARE IF:  You have persistent dizziness.  You have difficulty breathing or a congested sounding (croupy) cough.  You have an oral temperature above 102.5, not controlled by medicine.  There is increasing  pain or tenderness near or in the surgical site.        AMBULATORY SURGERY  DISCHARGE INSTRUCTIONS   The drugs that you were given will stay in your system until tomorrow so for the next 24 hours you should not:  Drive an automobile Make any legal decisions Drink any alcoholic beverage   You may resume regular meals tomorrow.  Today it is better to start with liquids and gradually work up to solid foods.  You may eat anything you prefer, but it is better to start with liquids, then soup and crackers, and gradually work up to solid foods.   Please notify your doctor immediately if you have any unusual bleeding, trouble breathing, redness and pain at the surgery site, drainage, fever, or pain not relieved by medication.    Additional Instructions:        Please contact your physician with any problems or Same Day Surgery at [phone], Monday through Friday 6 am to 4 pm, or [place] at [facility] number at [phone].

## 2021-07-03 NOTE — Anesthesia Preprocedure Evaluation (Signed)
Anesthesia Evaluation  Patient identified by MRN, date of birth, ID band Patient awake    Reviewed: Allergy & Precautions, NPO status , Patient's Chart, lab work & pertinent test results  History of Anesthesia Complications Negative for: history of anesthetic complications  Airway Mallampati: III  TM Distance: >3 FB Neck ROM: Full    Dental no notable dental hx. (+) Teeth Intact   Pulmonary neg pulmonary ROS, neg sleep apnea, neg COPD, Patient abstained from smoking.Not current smoker,    Pulmonary exam normal breath sounds clear to auscultation       Cardiovascular Exercise Tolerance: Good METS(-) hypertension(-) CAD and (-) Past MI negative cardio ROS  (-) dysrhythmias  Rhythm:Regular Rate:Normal - Systolic murmurs    Neuro/Psych  Headaches, PSYCHIATRIC DISORDERS Anxiety Depression    GI/Hepatic GERD  ,(+)     (-) substance abuse  ,   Endo/Other  neg diabetesMorbid obesity  Renal/GU negative Renal ROS     Musculoskeletal   Abdominal (+) + obese,   Peds  Hematology  (+) Blood dyscrasia, anemia , S/p 2 units PRBC transfusion outpatient   Anesthesia Other Findings Past Medical History: No date: Anemia     Comment:  otc iron supplement No date: AV block, 1st degree No date: Fibroid No date: GERD (gastroesophageal reflux disease)     Comment:  otc reflux reducer prn-has not used in last month-assoc               with foods No date: Infection     Comment:  UTI  Reproductive/Obstetrics                            Anesthesia Physical Anesthesia Plan  ASA: 3  Anesthesia Plan: General   Post-op Pain Management:    Induction: Intravenous  PONV Risk Score and Plan: 4 or greater and Ondansetron, Dexamethasone, Midazolam and Treatment may vary due to age or medical condition  Airway Management Planned: Oral ETT  Additional Equipment: None  Intra-op Plan:   Post-operative Plan:  Extubation in OR  Informed Consent: I have reviewed the patients History and Physical, chart, labs and discussed the procedure including the risks, benefits and alternatives for the proposed anesthesia with the patient or authorized representative who has indicated his/her understanding and acceptance.     Dental advisory given  Plan Discussed with: CRNA and Surgeon  Anesthesia Plan Comments: (Discussed risks of anesthesia with patient, including PONV, sore throat, lip/dental damage, possible blood transfusion. Rare risks discussed as well, such as cardiorespiratory and neurological sequelae. Patient understands.)        Anesthesia Quick Evaluation

## 2021-07-03 NOTE — Op Note (Signed)
Procedure(s): XI ROBOTIC ASSISTED MYOMECTOMY CHROMOPERTUBATION HYSTEROSCOPY WITH MYOSURE Procedure Note  Katie Medina female 35 y.o. 07/03/2021  Indications: The patient is a 35 y.o. G0P0 female with PCOS, fibroid uterus, history of anemia (s/p recent blood transfusion outpatient 2 weeks prior), infertility.  Previous history of robotic myomectomy and exploratory laparotomy.  Pre-operative Diagnosis: PCOS, fibroid uterus, history of anemia, infertility. Prior abdominal surgeries  Post-operative Diagnosis: Same, with dense pelvic adhesions.   Surgeon: Rubie Maid, MD  Assistants:  Jeannie Fend, MD. An experienced assistant was required given the standard of surgical care given the complexity of the case.  This assistant was needed for exposure, dissection, suctioning, retraction, instrument exchange, and for overall help during the procedure.  Anesthesia: General endotracheal anesthesia  Findings: Vertical supraumbilical scar noted on abdomen.  The uterus was sounded to 11 cm.  Enlarged uterus with multiple leiomyoma.   Dense omental and pelvic adhesions were visualized.  The left fallopian tube and ovary were densely adherent to the posterior surface of the uterus, as was the right ovary.  The right fallopian tube was noted to be densely adherent to the right pelvic sidewall as well as the round ligament.   Several peritubular cysts were visualized on the right fallopian tube.    The right lateral anterior surface of the uterus was also densely adherent to the anterior abdominal wall.  Intracavitary lesions (2 leiomyomas) noted, 1  along left uterine wall and 1 cervical.   Procedure Details: The patient was seen in the Holding Room. The risks, benefits, complications, treatment options, and expected outcomes were discussed with the patient.  The patient concurred with the proposed plan, giving informed consent.  The site of surgery properly noted/marked. The patient was taken to  the Operating Room, identified as Katie Medina and the procedure verified as Procedure(s) (LRB): XI ROBOTIC ASSISTED MYOMECTOMY (N/A), CHROMOPERTUBATION (N/A), HYSTEROSCOPY WITH MYOSURE (N/A). A Time Out was held and the above information confirmed.  She was then placed under general anesthesia without difficulty. She was placed in the dorsal lithotomy position, and was prepped and draped in a sterile manner.  A foley catheter was inserted and allowed to drain to gravity.  A sterile speculum was inserted into the vagina and the cervix was grasped at the anterior lip using a single-toothed tenaculum. Cervical dilation was performed. A 5 mm hysteroscope was introduced into the uterus under direct visualization. The cavity was allowed to fill, and then the entire cavity was explored with the findings described above. The hysteroscope was removed.   Attention was then turned to the abdomen, where  an 8 mm incision was made supraumbilically under direct visualization, using the daVinci camera.  Three more ports were then placed. There were two 8 mm ports that were placed 10 cm laterally to the umbilicus and 2 cm inferiorly on either side.  The 12 mm assistant port was then placed in the left upper quadrant at McBurney's point. The daVinci robot was then docked in the normal fashion. The patient was placed in steep Trendelenburg positioning.  Dense omental and pelvic adhesions were visualized.  The omental adhesions to the anterior abdominal wall were lysed using hot endoshears which allowed for adequate visualization of the pelvis. Approximately 25 minutes of procedure time was devoted to this.  After lysis of adhesions was performed, attention was then turned to the uterus where multiple uterine leiomyomata were noted.  The largest leiomyoma was identified. Vasopressin solution was injected over the surface of the leiomyoma  to aid with hemostasis.   A vertical incision was made over the uterine leiomyoma  into the leiomyoma, and the capsule was recognized.  Using blunt methods, the leiomyoma was freed from the surrounding myometrial tissue and removed intact.  Other leiomyomata were removed in similar fashion.  After removal of all the leiomyomata which were both on the fundal and posterior aspects of the uterus, the incisions were closed in layers. using 2-0 V-lock sutures stitches. Overall good hemostasis was noted.  Attempts were made to continue adhesiolysis as the left fallopian tube and ovary were densely adherent to the posterior surface of the uterus, as was the right ovary. The right fallopian tube was noted to be densely adherent to the right pelvic sidewall as well as the round ligament.  Several peritubular cysts were also noted.  The right lateral anterior surface of the uterus was also densely adherent to the anterior abdominal wall.  The anterior abdominal wall adhesions were lysed, however difficulties were noted freeing the adnexae from their adhesions due to risk of injury and no clear plane.  Chromopertubation was performed, with no visualization of spillage from either tube after approximately 50 ml of diluted methylene blue was instilled.   Next, the fibroids were grasped and placed in a large Endocatch bag. The robot was then undocked, and attention was turned to the abdomen where the the fibroids were morcellated using sharp dissection with the scalpel and mayo scissors and brought through the incision.  The 12 mm incision was extended to allow for morcellation and removal of the fibroids.  A small Alexis retractor was placed inside the incision to allow for more expansion of the incision. After removal of the fibroids and the Endocatch bag which was noted to be intact, the Alexis retractor was removed. The fascia was grasped with Kocher clamps and reapproximated with 0-Vicryl running stitch and the subcutaneous layer was reapproximated with 3-0 Vicryl suture in a running fashion.  The skin  of all incisions were closed with 4-0 Monocryl and Dermabond. Each of the incisions were injected with a total of 50 ml 1.3% Exparel solution.   Attention was then turned back to the pelvis where the sterile speculum was once again inserted into the vagina and the cervix was grasped at the anterior lip using a single-toothed tenaculum. The 5 mm hysteroscope was introduced into the uterus under direct visualization. The cavity was allowed to fill.  The Myosure device was then inserted into the uterine cavity and a myomectomy procedure was initiated, however due to equipment malfunction, the procedure had to be terminated.    The patient tolerated all procedures well. There were no complications during this case.  Sponge, lap, needle and instrument counts were correct x 2.  The patient was taken to the recovery room extubated and in stable condition.    Estimated Blood Loss:  50 ml      Drains: foley catheterization prior to procedure with  1100 ml of clear urine         Total IV Fluids:  2700 ml  Specimens: Uterine leiomyoma (3).          Implants: None         Complications:  None; patient tolerated the procedure well.         Disposition: PACU - hemodynamically stable.         Condition: stable   Rubie Maid, MD [facility]

## 2021-07-04 ENCOUNTER — Encounter: Payer: Self-pay | Admitting: Obstetrics and Gynecology

## 2021-07-04 DIAGNOSIS — D250 Submucous leiomyoma of uterus: Secondary | ICD-10-CM | POA: Diagnosis not present

## 2021-07-04 MED ORDER — SODIUM CHLORIDE 0.9 % IR SOLN
Status: DC | PRN
  Administered 2021-07-04: 1000 mL

## 2021-07-04 MED ORDER — SODIUM CHLORIDE 0.9 % IR SOLN
Status: DC | PRN
  Administered 2021-07-04: 3000 mL

## 2021-07-04 NOTE — Anesthesia Postprocedure Evaluation (Signed)
Anesthesia Post Note  Patient: ERIEL DUNCKEL  Procedure(s) Performed: XI ROBOTIC ASSISTED MYOMECTOMY CHROMOPERTUBATION DILATATION & CURETTAGE/HYSTEROSCOPY WITH Longfellow  Patient location during evaluation: PACU Anesthesia Type: General Level of consciousness: awake and alert Pain management: pain level controlled Vital Signs Assessment: post-procedure vital signs reviewed and stable Respiratory status: spontaneous breathing, nonlabored ventilation, respiratory function stable and patient connected to nasal cannula oxygen Cardiovascular status: blood pressure returned to baseline and stable Postop Assessment: no apparent nausea or vomiting Anesthetic complications: no   No notable events documented.   Last Vitals:  Vitals:   07/03/21 1833 07/03/21 1928  BP: (!) 142/85 (!) 147/82  Pulse: 71 74  Resp: 20 18  Temp: (!) 36.2 C   SpO2: 100% 100%    Last Pain:  Vitals:   07/04/21 1143  TempSrc:   PainSc: 5                  Molli Barrows

## 2021-07-05 LAB — SURGICAL PATHOLOGY

## 2021-07-11 NOTE — Progress Notes (Signed)
    OBSTETRICS/GYNECOLOGY POST-OPERATIVE CLINIC VISIT  Subjective:     Katie Medina is a 35 y.o. female who presents to the clinic 9 days status post robotic  myomectomy and dilatation and curettage/ hysteroscopy with myosure  for fibroids. Eating a regular diet without difficulty. Bowel movements are normal. Pain is controlled with current analgesics. Medications being used: acetaminophen and prescription NSAID's including ibuprofen (Motrin).   The following portions of the patient's history were reviewed and updated as appropriate: allergies, current medications, past family history, past medical history, past social history, past surgical history, and problem list.  Review of Systems Pertinent items noted in HPI and remainder of comprehensive ROS otherwise negative.   Objective:   There were no vitals taken for this visit. There is no height or weight on file to calculate BMI.  General:  alert and no distress  Abdomen: soft, bowel sounds active, non-tender  Incision:   healing well, no drainage, no erythema, no hernia, no seroma, no swelling, no dehiscence, incision well approximated    Pathology:   A. UTERINE FIBROIDS; MYOMECTOMY:  - MULTIPLE FRAGMENTS OF LEIOMYOMATA WITH FOCAL ISCHEMIC CHANGE.  - FRAGMENTS OF BENIGN ENDOMETRIUM AND LEIOMYOMATA, CONSISTENT WITH  SUBMUCOSAL LEIOMYOMATA (CONTENTS OF MESH BAG).  - NEGATIVE FOR ATYPIA AND MALIGNANCY.   Assessment:   Patient s/p robotic  myomectomy and dilatation and curettage/ hysteroscopy with myosure for fibroids.  Doing well postoperatively.   Plan:   1. Continue any current medications as instructed by provider. 2. Wound care discussed. 3. Operative findings again reviewed. Pathology report discussed. Reviewed issues with fertility, would require reproductive specialist at this time.  4. Activity restrictions:  pelvic rest x 1 week. No excessive bending/squatting.  5. Anticipated return to work: 1-2 weeks. 6. Follow  up: as needed.    Rubie Maid, MD [facility]

## 2021-07-12 ENCOUNTER — Encounter: Payer: Self-pay | Admitting: Obstetrics and Gynecology

## 2021-07-12 ENCOUNTER — Other Ambulatory Visit: Payer: Self-pay

## 2021-07-12 ENCOUNTER — Ambulatory Visit (INDEPENDENT_AMBULATORY_CARE_PROVIDER_SITE_OTHER): Payer: 59 | Admitting: Obstetrics and Gynecology

## 2021-07-12 VITALS — BP 133/85 | HR 84 | Ht 64.0 in | Wt 225.9 lb

## 2021-07-12 DIAGNOSIS — Z09 Encounter for follow-up examination after completed treatment for conditions other than malignant neoplasm: Secondary | ICD-10-CM

## 2021-07-15 ENCOUNTER — Other Ambulatory Visit: Payer: Self-pay | Admitting: Obstetrics and Gynecology

## 2021-11-15 ENCOUNTER — Encounter: Payer: 59 | Admitting: Obstetrics and Gynecology

## 2022-01-24 ENCOUNTER — Encounter: Payer: Self-pay | Admitting: Obstetrics and Gynecology

## 2022-01-24 ENCOUNTER — Other Ambulatory Visit: Payer: Self-pay

## 2022-01-24 ENCOUNTER — Other Ambulatory Visit (HOSPITAL_COMMUNITY)
Admission: RE | Admit: 2022-01-24 | Discharge: 2022-01-24 | Disposition: A | Discharge status: Home / Self Care | Payer: 59 | Source: Ambulatory Visit | Attending: Obstetrics and Gynecology | Admitting: Obstetrics and Gynecology

## 2022-01-24 ENCOUNTER — Other Ambulatory Visit: Payer: Self-pay | Admitting: Obstetrics and Gynecology

## 2022-01-24 ENCOUNTER — Ambulatory Visit (INDEPENDENT_AMBULATORY_CARE_PROVIDER_SITE_OTHER): Payer: 59 | Admitting: Obstetrics and Gynecology

## 2022-01-24 VITALS — BP 131/84 | HR 84 | Ht 64.0 in | Wt 238.0 lb

## 2022-01-24 DIAGNOSIS — N63 Unspecified lump in unspecified breast: Secondary | ICD-10-CM

## 2022-01-24 DIAGNOSIS — R102 Pelvic and perineal pain unspecified side: Secondary | ICD-10-CM

## 2022-01-24 DIAGNOSIS — N923 Ovulation bleeding: Secondary | ICD-10-CM

## 2022-01-24 DIAGNOSIS — E282 Polycystic ovarian syndrome: Secondary | ICD-10-CM

## 2022-01-24 DIAGNOSIS — N644 Mastodynia: Secondary | ICD-10-CM

## 2022-01-24 DIAGNOSIS — Z86018 Personal history of other benign neoplasm: Secondary | ICD-10-CM

## 2022-01-24 MED ORDER — DROSPIRENONE-ETHINYL ESTRADIOL 3-0.02 MG PO TABS: 1.0000 | ORAL_TABLET | Freq: Every day | ORAL | 0 refills | Status: DC

## 2022-01-24 NOTE — Progress Notes (Addendum)
° ° °  GYNECOLOGY PROGRESS NOTE  Subjective:    Patient ID: Katie Medina, female    DOB: [birth year], 36 y.o.   MRN: [id number]  HPI  Patient is a 36 y.o. G0P0 female with a h/o PCOS, fibroid uterus (s/p myomectomy in 06/2021) who presents for several complaints:  Notes right breast pain x 1 year, gradually worsening. Pain feels like dull achy pressure in the outer breast.  Also feels swollen lymph nodes. Not on any hormonal agents.  Also noting sharp pains in her vagina, this is sporadic.  Lasts for 1-2 seconds. Does report some intermenstrual spotting/light bleeding, especially with increased physical activity or stress. Currently sexually active. Used a douche recently.   The following portions of the patient's history were reviewed and updated as appropriate: allergies, current medications, past family history, past medical history, past social history, past surgical history, and problem list.  Review of Systems Pertinent items noted in HPI and remainder of comprehensive ROS otherwise negative.   Objective:   Blood pressure 131/84, pulse 84, height 5\' 4"  (1.626 m), weight 238 lb (108 kg), last menstrual period 01/18/2022, SpO2 98 %. Body mass index is 40.85 kg/m. General appearance: alert and no distress Breasts: normal appearance, no masses or tenderness Abdomen: soft, non-tender; bowel sounds normal; no masses,  no organomegaly Pelvic: external genitalia normal, rectovaginal septum normal.  Vagina with small amount of dark red blood and clot.  No discharge seen.  Cervix normal appearing, no lesions and no motion tenderness.  Uterus mobile, nontender, normal shape and size.  Adnexae non-palpable, nontender bilaterally.  Extremities: extremities normal, atraumatic, no cyanosis or edema Neurologic: Grossly normal   Assessment:   1. Breast pain   2. Breast nodule   3. Intermenstrual bleeding   4. PCOS (polycystic ovarian syndrome)   5. History of uterine fibroid   6. Vaginal pain      Plan:   Will place order for mammogram and ultrasound for breast issues.   Vaginal culture performed for intermenstrual bleeding and vaginal pain, however no other abnormal findings noted on exam.  Patient with h/o uterine fibroids, will order ultrasound to rule out new fibroid development. Could also be a cause of intermenstrual bleeding H/o PCOS, currently on any management. Discussed that breast pain, abnormal bleeding could also be a function of PCOS hormonal issues. Discussed trial of OCPs to assess if symptoms resolve.  Will prescribe Yaz x 3 months. To f/u after trial  Will notify patient of all results by Mychart.    Rubie Maid, MD [facility]

## 2022-01-25 ENCOUNTER — Encounter: Payer: Self-pay | Admitting: Obstetrics and Gynecology

## 2022-01-25 ENCOUNTER — Telehealth: Payer: Self-pay

## 2022-01-25 LAB — CERVICOVAGINAL ANCILLARY ONLY
Bacterial Vaginitis (gardnerella): POSITIVE — AB
Candida Glabrata: NEGATIVE
Candida Vaginitis: NEGATIVE
Chlamydia: NEGATIVE
Comment: NEGATIVE
Comment: NEGATIVE
Comment: NEGATIVE
Comment: NEGATIVE
Comment: NEGATIVE
Comment: NORMAL
Neisseria Gonorrhea: NEGATIVE
Trichomonas: NEGATIVE

## 2022-01-25 MED ORDER — SOLOSEC 2 G PO PACK: 1.0000 | PACK | Freq: Once | ORAL | 1 refills | Status: AC

## 2022-01-25 NOTE — Addendum Note (Signed)
Addended by: Augusto Gamble on: 01/25/2022 03:47 PM   Modules accepted: Orders

## 2022-01-26 ENCOUNTER — Other Ambulatory Visit: Payer: Self-pay

## 2022-01-26 ENCOUNTER — Ambulatory Visit
Admission: RE | Admit: 2022-01-26 | Discharge: 2022-01-26 | Disposition: A | Discharge status: Home / Self Care | Payer: 59 | Source: Ambulatory Visit | Attending: Obstetrics and Gynecology | Admitting: Obstetrics and Gynecology

## 2022-01-26 DIAGNOSIS — N63 Unspecified lump in unspecified breast: Secondary | ICD-10-CM | POA: Insufficient documentation

## 2022-01-26 DIAGNOSIS — N644 Mastodynia: Secondary | ICD-10-CM | POA: Insufficient documentation

## 2022-01-31 ENCOUNTER — Other Ambulatory Visit: Payer: Self-pay

## 2022-01-31 ENCOUNTER — Ambulatory Visit (INDEPENDENT_AMBULATORY_CARE_PROVIDER_SITE_OTHER): Payer: 59

## 2022-01-31 DIAGNOSIS — R102 Pelvic and perineal pain: Secondary | ICD-10-CM

## 2022-01-31 DIAGNOSIS — N923 Ovulation bleeding: Secondary | ICD-10-CM

## 2022-01-31 DIAGNOSIS — E282 Polycystic ovarian syndrome: Secondary | ICD-10-CM | POA: Diagnosis not present

## 2022-01-31 DIAGNOSIS — N644 Mastodynia: Secondary | ICD-10-CM

## 2022-01-31 DIAGNOSIS — Z86018 Personal history of other benign neoplasm: Secondary | ICD-10-CM | POA: Diagnosis not present

## 2022-01-31 DIAGNOSIS — N63 Unspecified lump in unspecified breast: Secondary | ICD-10-CM

## 2022-05-02 ENCOUNTER — Telehealth: Payer: 59 | Admitting: Physician Assistant

## 2022-05-02 DIAGNOSIS — H9202 Otalgia, left ear: Secondary | ICD-10-CM

## 2022-05-02 MED ORDER — NAPROXEN 500 MG PO TABS: 500.0000 mg | ORAL_TABLET | Freq: Two times a day (BID) | ORAL | 0 refills | Status: DC

## 2022-05-02 MED ORDER — NEOMYCIN-POLYMYXIN-HC 1 % OT SOLN: 3.0000 [drp] | Freq: Four times a day (QID) | OTIC | 0 refills | Status: DC

## 2022-05-02 NOTE — Progress Notes (Signed)
?Virtual Visit Consent  ? ?Katie Medina, you are scheduled for a virtual visit with a [facility] provider today. Just as with appointments in the office, your consent must be obtained to participate. Your consent will be active for this visit and any virtual visit you may have with one of our providers in the next 365 days. If you have a MyChart account, a copy of this consent can be sent to you electronically. ? ?As this is a virtual visit, video technology does not allow for your provider to perform a traditional examination. This may limit your provider's ability to fully assess your condition. If your provider identifies any concerns that need to be evaluated in person or the need to arrange testing (such as labs, EKG, etc.), we will make arrangements to do so. Although advances in technology are sophisticated, we cannot ensure that it will always work on either your end or our end. If the connection with a video visit is poor, the visit may have to be switched to a telephone visit. With either a video or telephone visit, we are not always able to ensure that we have a secure connection. ? ?By engaging in this virtual visit, you consent to the provision of healthcare and authorize for your insurance to be billed (if applicable) for the services provided during this visit. Depending on your insurance coverage, you may receive a charge related to this service. ? ?I need to obtain your verbal consent now. Are you willing to proceed with your visit today? [name] has provided verbal consent on 05/02/2022 for a virtual visit (video or telephone). Leeanne Rio, PA-C ? ?Date: 05/02/2022 5:26 PM ? ?Virtual Visit via Video Note  ? ?ILeeanne Rio, connected with  Katie Medina  ([id number], 26-Oct-1986) on 05/02/22 at  5:15 PM EDT by a video-enabled telemedicine application and verified that I am speaking with the correct person using two identifiers. ? ?Location: ?Patient: Virtual Visit  Location Patient: Home ?Provider: Virtual Visit Location Provider: Home Office ?  ?I discussed the limitations of evaluation and management by telemedicine and the availability of in person appointments. The patient expressed understanding and agreed to proceed.   ? ?History of Present Illness: ?Katie Medina is a 36 y.o. who identifies as a female who was assigned female at birth, and is being seen today for pain of L ear starting yesterday. Pain is constant but is alleviated slightly with a heating pad. Denies fever, chills. Denies cough or cold symptoms.. Denies symptoms of R ear at present besides her chronic tinnitus. Has had this issue yearly, requiring antibiotic drops and anti-inflammatory medications. Is followed by ENT but unhappy with her care there.  ? ?HPI: HPI  ?Problems:  ?Patient Active Problem List  ? Diagnosis Date Noted  ? Lower back pain 12/21/2020  ? Abdominal pain 12/07/2020  ? Elevated blood pressure reading 11/03/2020  ? Depression with anxiety 11/03/2020  ? Chronic nonintractable headache 04/14/2019  ? Sinus pressure 04/14/2019  ? Chronic GERD 07/22/2018  ? Constipation 07/22/2018  ? PCOS (polycystic ovarian syndrome) 07/22/2018  ? Hyperlipidemia 07/22/2018  ? Hidradenitis suppurativa 07/22/2018  ? Health care maintenance 07/22/2018  ? Obesity 07/22/2018  ? Anemia   ? AV block, 1st degree   ? Fibroids 07/19/2012  ?  Class: Chronic  ?  ?Allergies:  ?Allergies  ?Allergen Reactions  ? Penicillins   ?  Unknown reaction ?Tolerated 1st generation cephalosporin (cefazolin) on 07/17/2012 - B.Pearline Cables, FNP-C  ?  Percocet [Oxycodone-Acetaminophen] Other (See Comments)  ?  Causes back pain.  ? ?Medications:  ?Current Outpatient Medications:  ?  naproxen (NAPROSYN) 500 MG tablet, Take 1 tablet (500 mg total) by mouth 2 (two) times daily with a meal., Disp: 30 tablet, Rfl: 0 ?  NEOMYCIN-POLYMYXIN-HYDROCORTISONE (CORTISPORIN) 1 % SOLN OTIC solution, Place 3 drops into the left ear 4 (four) times daily. For  5-7 days, Disp: 10 mL, Rfl: 0 ?  acetaminophen (TYLENOL) 325 MG tablet, Take 650 mg by mouth every 6 (six) hours as needed for moderate pain or headache., Disp: , Rfl:  ?  Cholecalciferol (VITAMIN D3 PO), Take 1 tablet by mouth daily., Disp: , Rfl:  ?  docusate sodium (COLACE) 100 MG capsule, Take 1 capsule (100 mg total) by mouth 2 (two) times daily as needed., Disp: 30 capsule, Rfl: 2 ?  drospirenone-ethinyl estradiol (YAZ) 3-0.02 MG tablet, Take 1 tablet by mouth daily., Disp: 84 tablet, Rfl: 0 ?  fluticasone (FLONASE) 50 MCG/ACT nasal spray, Place 1 spray into both nostrils daily for 14 days. (Patient taking differently: Place 1 spray into both nostrils daily as needed for allergies.), Disp: 16 g, Rfl: 0 ?  ibuprofen (ADVIL) 600 MG tablet, Take 1 tablet (600 mg total) by mouth every 6 (six) hours as needed., Disp: 60 tablet, Rfl: 3 ?  Iron-FA-B Cmp-C-Biot-Probiotic (FUSION PLUS PO), Take 2 tablets by mouth daily., Disp: , Rfl:  ?  multivitamin-lutein (OCUVITE-LUTEIN) CAPS capsule, Take 1 capsule by mouth once a week., Disp: , Rfl:  ?  OVER THE COUNTER MEDICATION, Take 1 Scoop by mouth daily. Calcium Carbonate Powder for acid reflux, mix with water and drink, Disp: , Rfl:  ?  OVER THE COUNTER MEDICATION, Take 3 capsules by mouth daily as needed (first 3 days of menses). [employer] Goods: Endocrine and Ovarian Support, Disp: , Rfl:  ?  Phenylephrine-Acetaminophen (TYLENOL SINUS+HEADACHE) 5-325 MG TABS, Take 1 tablet by mouth daily as needed (sinus headache)., Disp: , Rfl:  ?  simethicone (GAS-X) 80 MG chewable tablet, Chew 1 tablet (80 mg total) by mouth 4 (four) times daily as needed for flatulence., Disp: 30 tablet, Rfl: 2 ? ?Observations/Objective: ?Patient is well-developed, well-nourished in no acute distress.  ?Resting comfortably at home.  ?Head is normocephalic, atraumatic.  ?No labored breathing. ?Speech is clear and coherent with logical content.  ?Patient is alert and oriented at baseline.   ? ?Assessment and Plan: ?1. Acute otalgia, left ?- NEOMYCIN-POLYMYXIN-HYDROCORTISONE (CORTISPORIN) 1 % SOLN OTIC solution; Place 3 drops into the left ear 4 (four) times daily. For 5-7 days  Dispense: 10 mL; Refill: 0 ?- naproxen (NAPROSYN) 500 MG tablet; Take 1 tablet (500 mg total) by mouth 2 (two) times daily with a meal.  Dispense: 30 tablet; Refill: 0 ? ?Possible start of otitis externa giving starting after exposure to colder air and water. Wills tart Cortisporin otic drops. Continue warm compresses. Naprosyn per orders. Follow-up with ENT.  ? ?Follow Up Instructions: ?I discussed the assessment and treatment plan with the patient. The patient was provided an opportunity to ask questions and all were answered. The patient agreed with the plan and demonstrated an understanding of the instructions.  A copy of instructions were sent to the patient via MyChart unless otherwise noted below.  ? ?The patient was advised to call back or seek an in-person evaluation if the symptoms worsen or if the condition fails to improve as anticipated. ? ?Time:  ?I spent 10 minutes with the patient via telehealth  technology discussing the above problems/concerns.   ? ?Leeanne Rio, PA-C ?

## 2022-05-02 NOTE — Patient Instructions (Signed)
?Wilburn Cornelia, thank you for joining Leeanne Rio, PA-C for today's virtual visit.  While this provider is not your primary care provider (PCP), if your PCP is located in our provider database this encounter information will be shared with them immediately following your visit. ? ?Consent: ?(Patient) NEVADA KIRCHNER provided verbal consent for this virtual visit at the beginning of the encounter. ? ?Current Medications: ? ?Current Outpatient Medications:  ?  acetaminophen (TYLENOL) 325 MG tablet, Take 650 mg by mouth every 6 (six) hours as needed for moderate pain or headache., Disp: , Rfl:  ?  Cholecalciferol (VITAMIN D3 PO), Take 1 tablet by mouth daily., Disp: , Rfl:  ?  docusate sodium (COLACE) 100 MG capsule, Take 1 capsule (100 mg total) by mouth 2 (two) times daily as needed., Disp: 30 capsule, Rfl: 2 ?  drospirenone-ethinyl estradiol (YAZ) 3-0.02 MG tablet, Take 1 tablet by mouth daily., Disp: 84 tablet, Rfl: 0 ?  fluticasone (FLONASE) 50 MCG/ACT nasal spray, Place 1 spray into both nostrils daily for 14 days. (Patient taking differently: Place 1 spray into both nostrils daily as needed for allergies.), Disp: 16 g, Rfl: 0 ?  ibuprofen (ADVIL) 600 MG tablet, Take 1 tablet (600 mg total) by mouth every 6 (six) hours as needed., Disp: 60 tablet, Rfl: 3 ?  Iron-FA-B Cmp-C-Biot-Probiotic (FUSION PLUS PO), Take 2 tablets by mouth daily., Disp: , Rfl:  ?  multivitamin-lutein (OCUVITE-LUTEIN) CAPS capsule, Take 1 capsule by mouth once a week., Disp: , Rfl:  ?  OVER THE COUNTER MEDICATION, Take 1 Scoop by mouth daily. Calcium Carbonate Powder for acid reflux, mix with water and drink, Disp: , Rfl:  ?  OVER THE COUNTER MEDICATION, Take 3 capsules by mouth daily as needed (first 3 days of menses). [employer]: Endocrine and Ovarian Support, Disp: , Rfl:  ?  Phenylephrine-Acetaminophen (TYLENOL SINUS+HEADACHE) 5-325 MG TABS, Take 1 tablet by mouth daily as needed (sinus headache)., Disp: , Rfl:   ?  simethicone (GAS-X) 80 MG chewable tablet, Chew 1 tablet (80 mg total) by mouth 4 (four) times daily as needed for flatulence., Disp: 30 tablet, Rfl: 2  ? ?Medications ordered in this encounter:  ?No orders of the defined types were placed in this encounter. ?  ? ?*If you need refills on other medications prior to your next appointment, please contact your pharmacy* ? ?Follow-Up: ?Call back or seek an in-person evaluation if the symptoms worsen or if the condition fails to improve as anticipated. ? ?Other Instructions ?Please use the medications as directed. ?Continue warm compresses. ?Follow-up with your PCP or current ENT if not resolving.  ? ?If you are looking for a new ENT practice -- Dr. Lucia Gaskins  ?[facility]. ?[place], [place] [address] ?[phone] ? ? ?If you have been instructed to have an in-person evaluation today at a local Urgent Care facility, please use the link below. It will take you to a list of all of our available [facility], including address, phone number and hours of operation. Please do not delay care.  ?[facility] ? ?If you or a family member do not have a primary care provider, use the link below to schedule a visit and establish care. When you choose a [facility] primary care physician or advanced practice provider, you gain a long-term partner in health. ?Find a Primary Care Provider ? ?Learn more about [facility] in-office and virtual care options: ?[facility]  ?

## 2022-05-11 IMAGING — MG DIGITAL DIAGNOSTIC BILAT W/ TOMO W/ CAD
6 of 12 series · 6 of 36 positions shown · non-contrast
Comparison: Previous exam(s).

CLINICAL DATA: The patient presented with a lump in 3195 in the
right breast which was noted to represent a prominent lymph node.
The lymph node was followed for 2 years without change and
considered benign. The patient intermittently feels this lump still
but it feels smaller to her. She also has pain intermittently in the
same region of the breast which may or may not be associated with
the lump.

EXAM:
DIGITAL DIAGNOSTIC BILATERAL MAMMOGRAM WITH TOMOSYNTHESIS AND CAD;
ULTRASOUND RIGHT BREAST LIMITED
TECHNIQUE: Bilateral digital diagnostic mammography and breast tomosynthesis
was performed. The images were evaluated with computer-aided
detection.; Targeted ultrasound examination of the right breast was
performed

[L MLO synth-2D (1 of 2)]
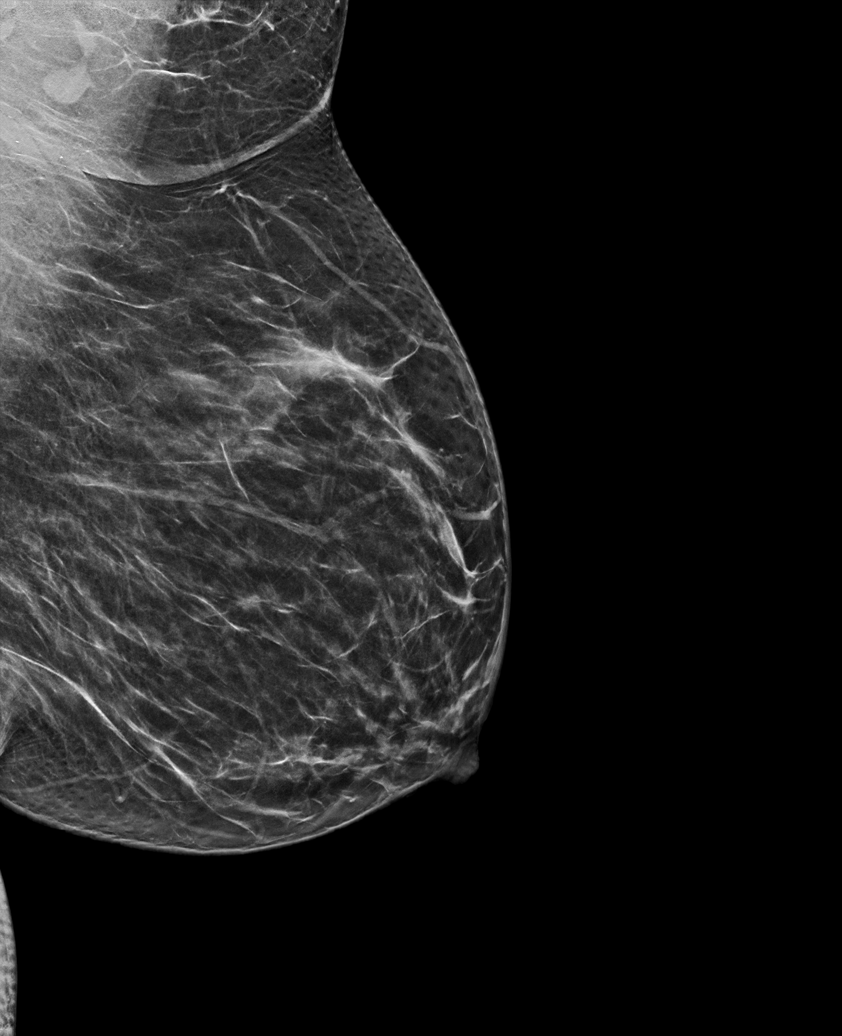

[R MLO synth-2D]
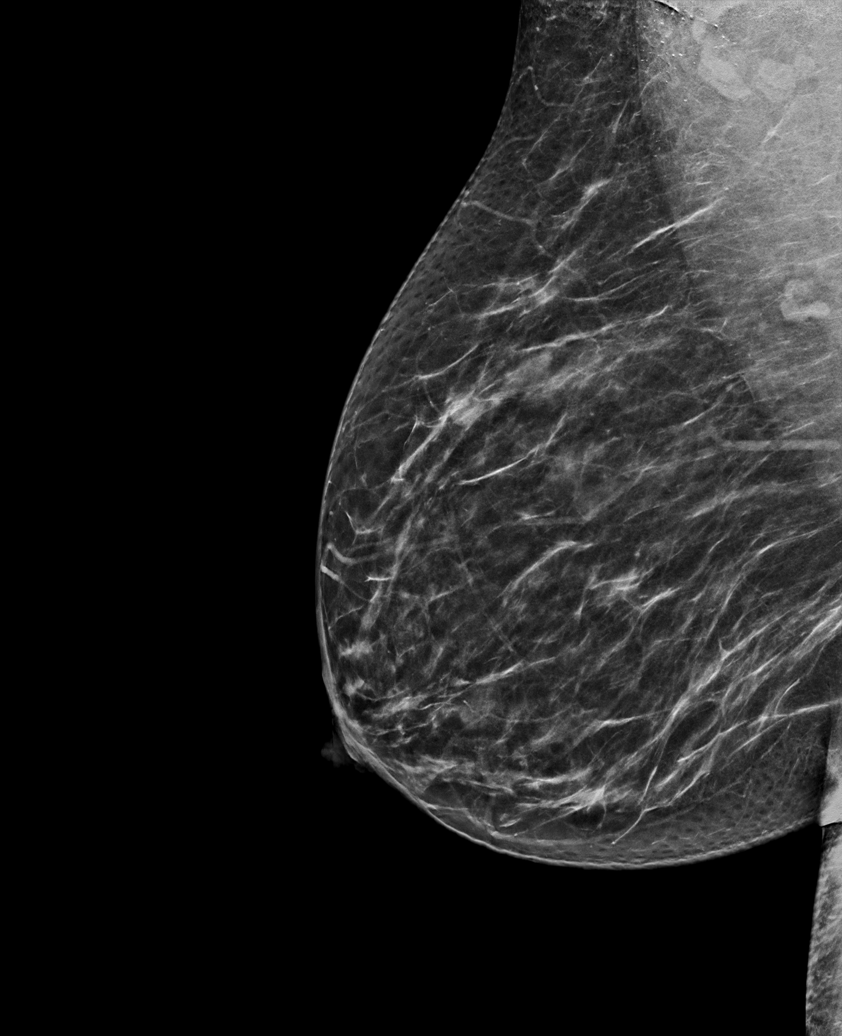

[R CC synth-2D]
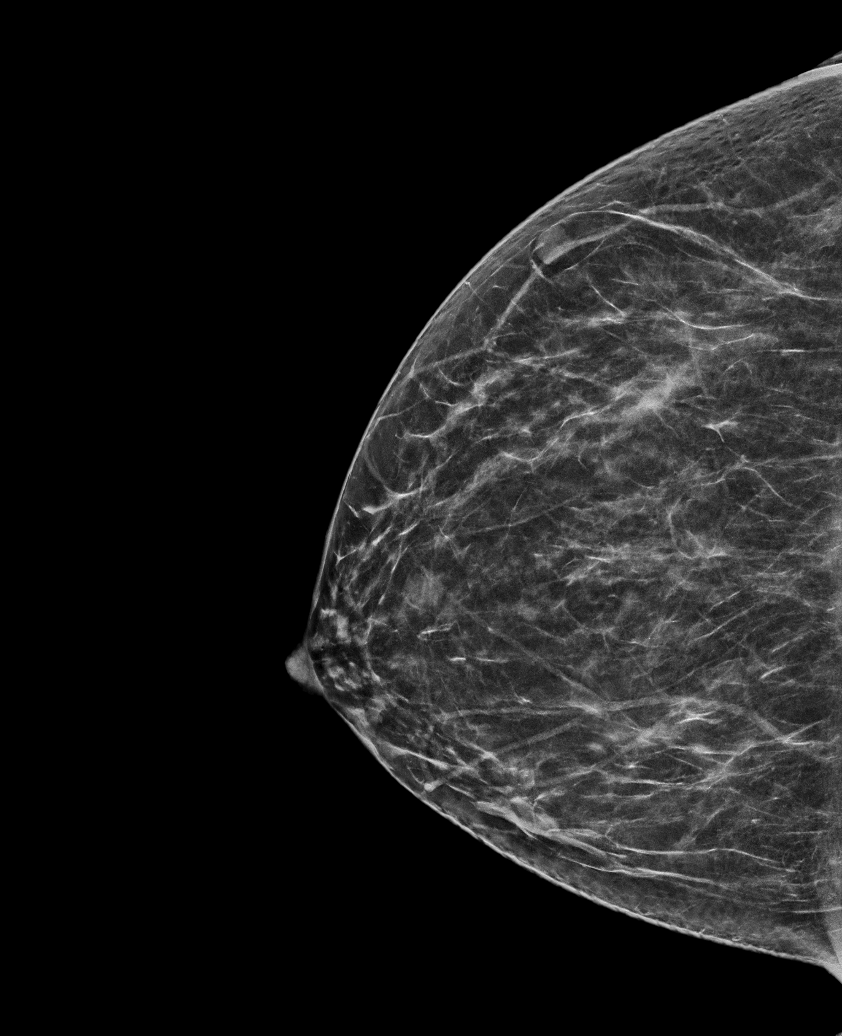

[L CC synth-2D (1 of 2)]
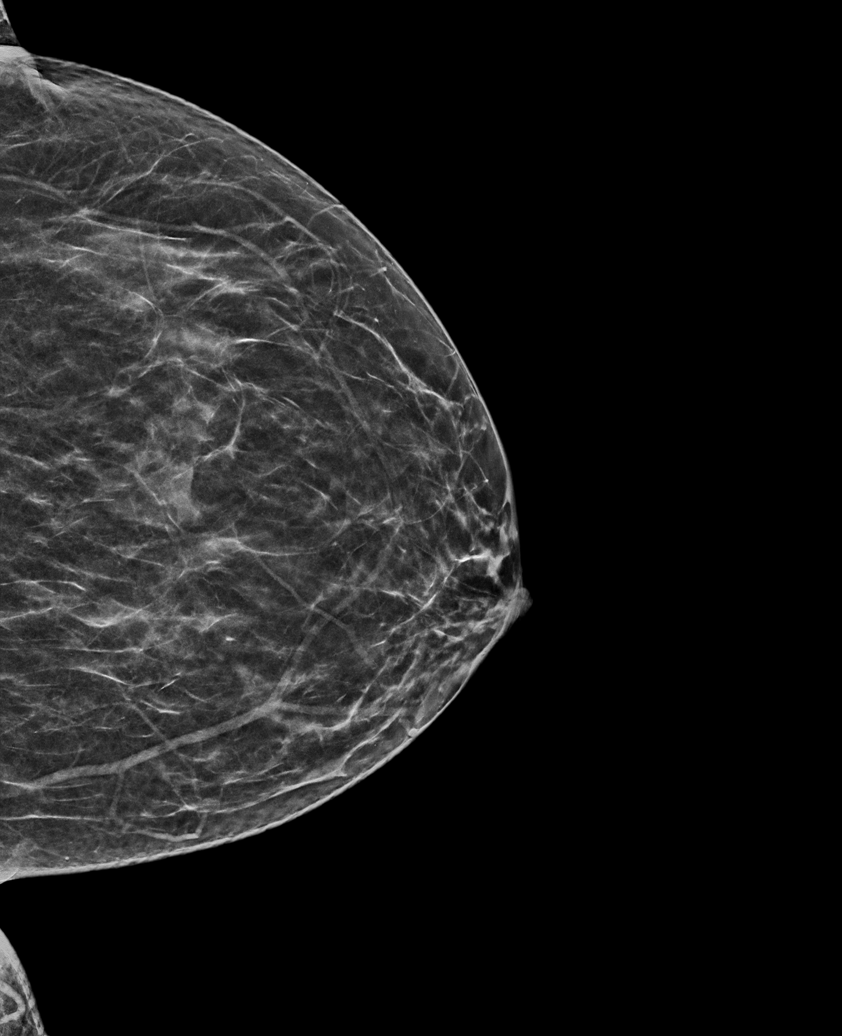

[L MLO synth-2D (2 of 2)]
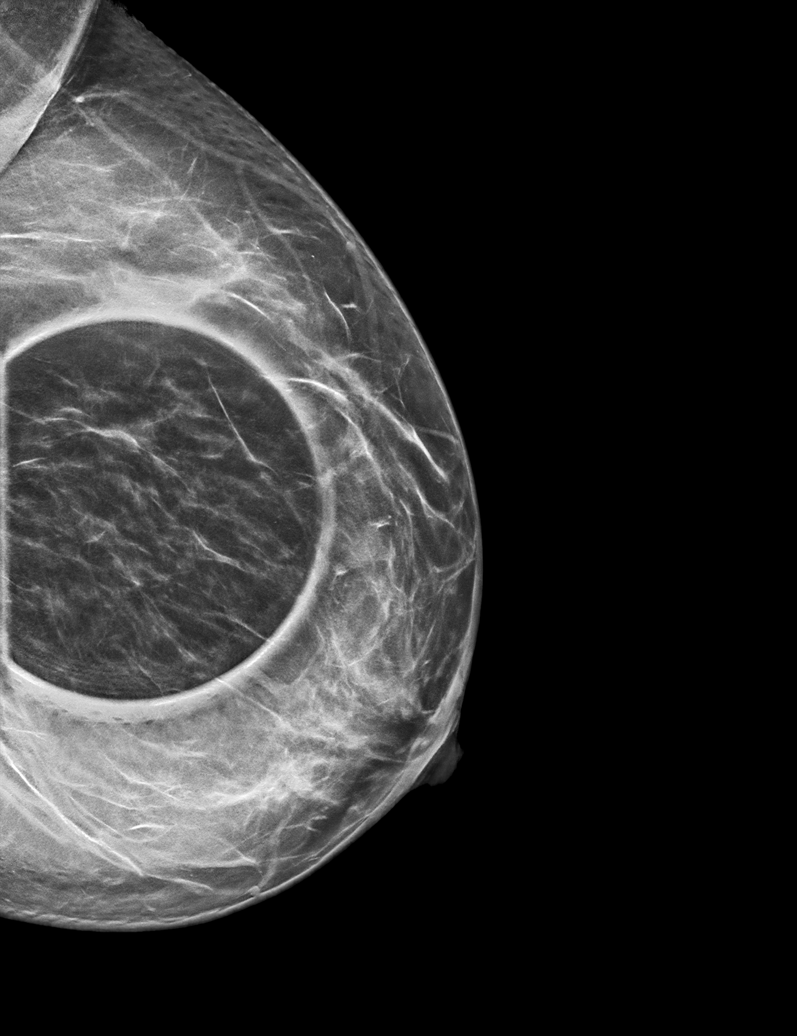

[L CC synth-2D (2 of 2)]
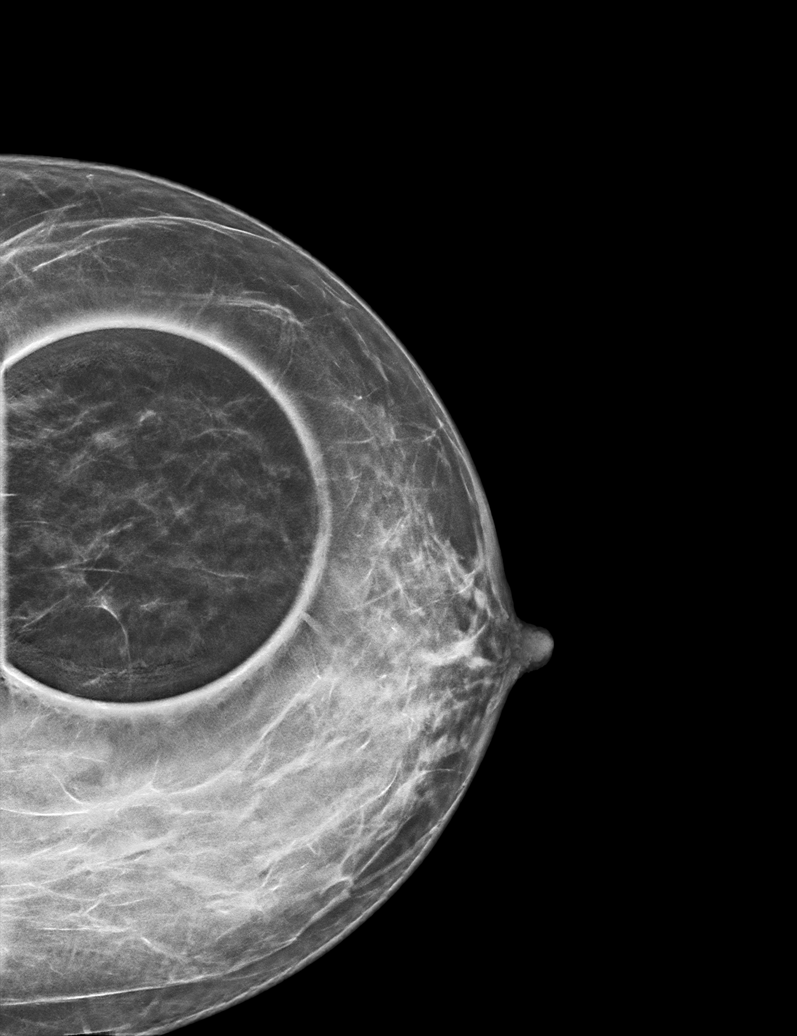

[6 of 36 positions shown; findings below may reference images not displayed]

ACR Breast Density Category b: There are scattered areas of
fibroglandular density.
FINDINGS: The known lymph node in the lateral right breast is smaller compared
to 3195 but unchanged since 6730. A focal asymmetry/subtle mass in
the anterior right breast just lateral to the nipple is unchanged
and considered benign as well. There are lymph nodes in the inferior
right breast which are smaller compared to 3195. Asymmetries in the
left breast resolve on additional imaging. No new or suspicious
findings identified in either breast.

Targeted ultrasound is performed, showing the previously identified
lymph node without significant change. The cortex remains prominent
suggesting a reactive node. The patient has hidradenitis, likely
explaining the prominent node. No other suspicious findings in the
right breast.
IMPRESSION: The patient has a continued prominent node in the lateral right
breast which is smaller compared to 3195 and unchanged since 6730,
considered benign. This is likely a mildly reactive nodes given the
history of hidradenitis. No other suspicious findings in either
breast.

RECOMMENDATION:
Recommend annual screening mammography beginning at the age of 40.

I have discussed the findings and recommendations with the patient.
If applicable, a reminder letter will be sent to the patient
regarding the next appointment.

BI-RADS CATEGORY  2: Benign.

## 2022-05-11 IMAGING — US US BREAST*R* LIMITED INC AXILLA
1 series · 6 of 6 positions shown · non-contrast
Comparison: Previous exam(s).

CLINICAL DATA: The patient presented with a lump in 3195 in the
right breast which was noted to represent a prominent lymph node.
The lymph node was followed for 2 years without change and
considered benign. The patient intermittently feels this lump still
but it feels smaller to her. She also has pain intermittently in the
same region of the breast which may or may not be associated with
the lump.

EXAM:
DIGITAL DIAGNOSTIC BILATERAL MAMMOGRAM WITH TOMOSYNTHESIS AND CAD;
ULTRASOUND RIGHT BREAST LIMITED
TECHNIQUE: Bilateral digital diagnostic mammography and breast tomosynthesis
was performed. The images were evaluated with computer-aided
detection.; Targeted ultrasound examination of the right breast was
performed

[Series 1: us breast*right* limited inc axilla · 0.06mm/px · 6 of 6 slices shown]
[im 1/6]
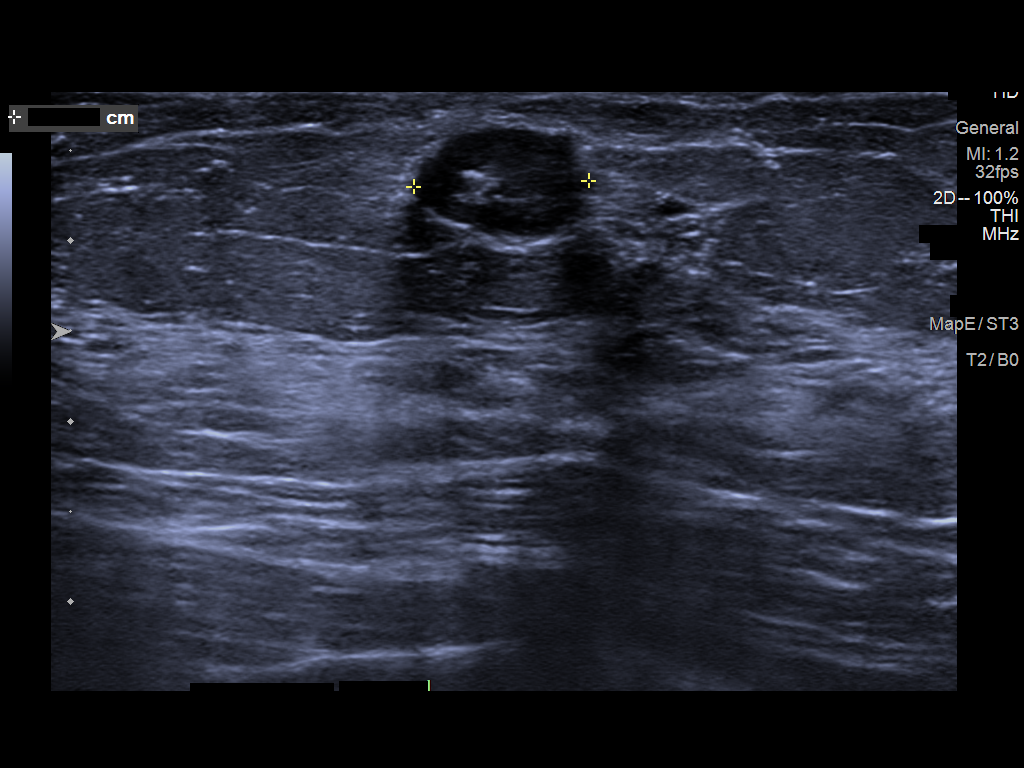
[im 2/6]
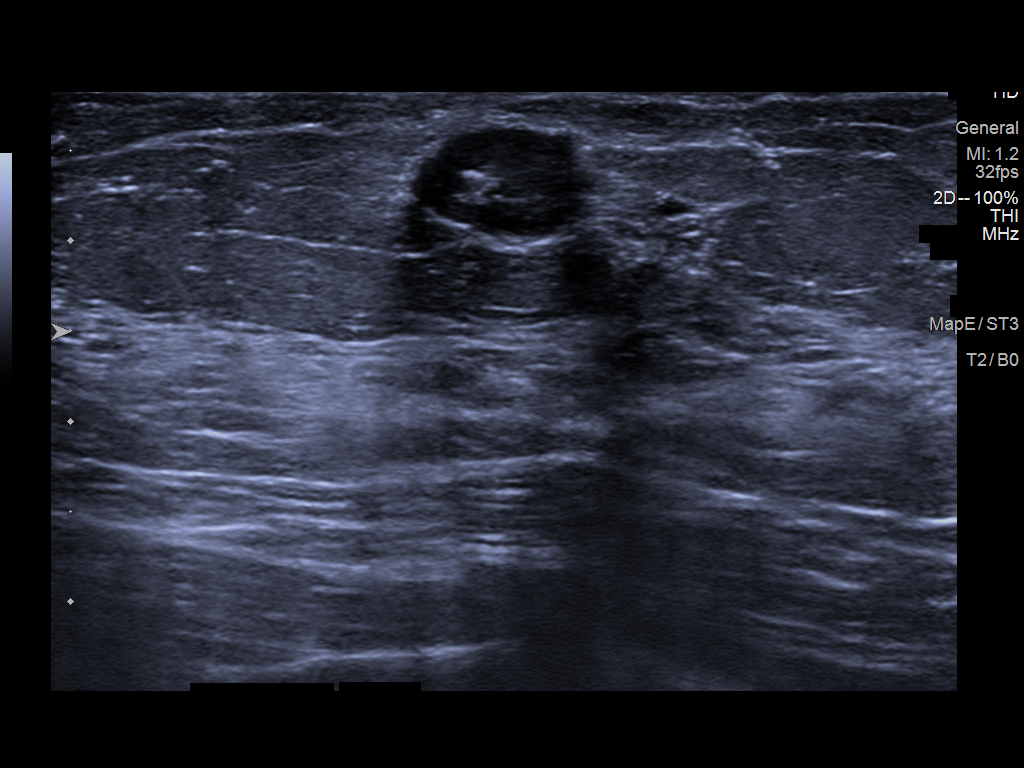
[im 3/6]
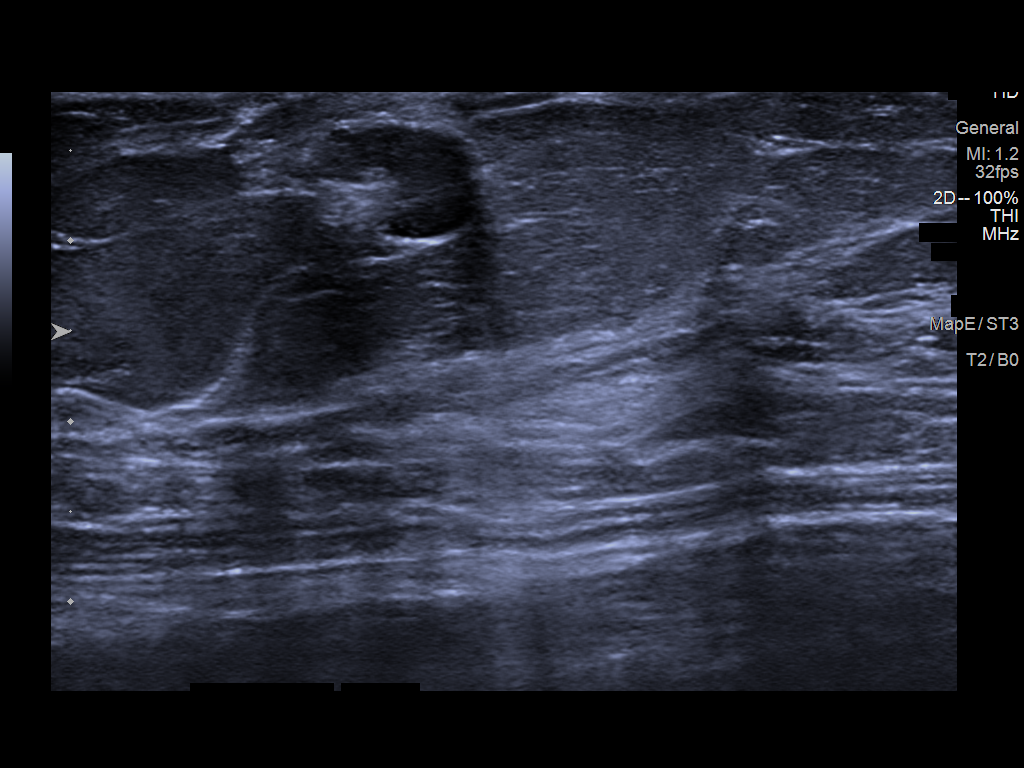
[im 4/6]
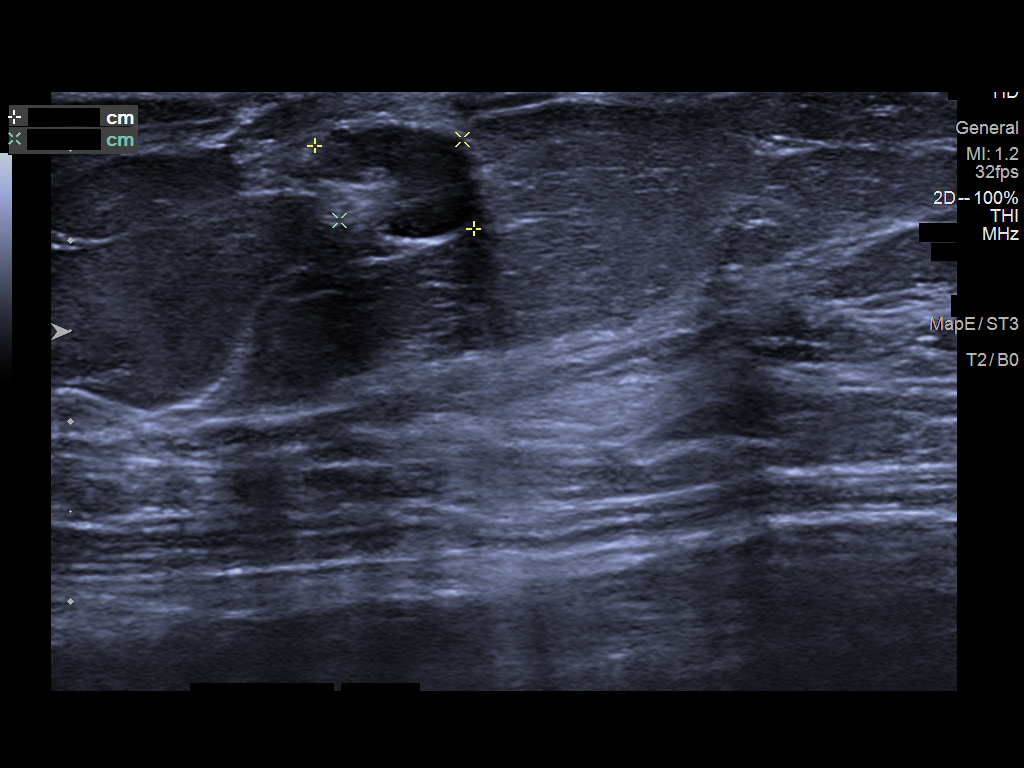
[im 5/6]
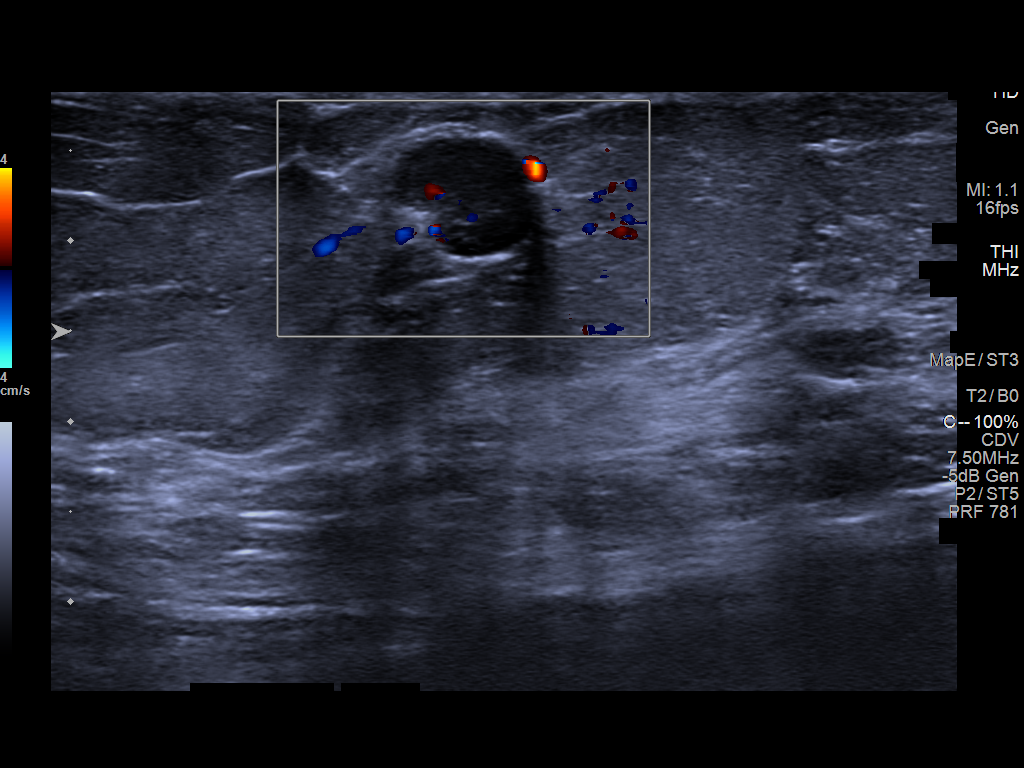
[im 6/6]
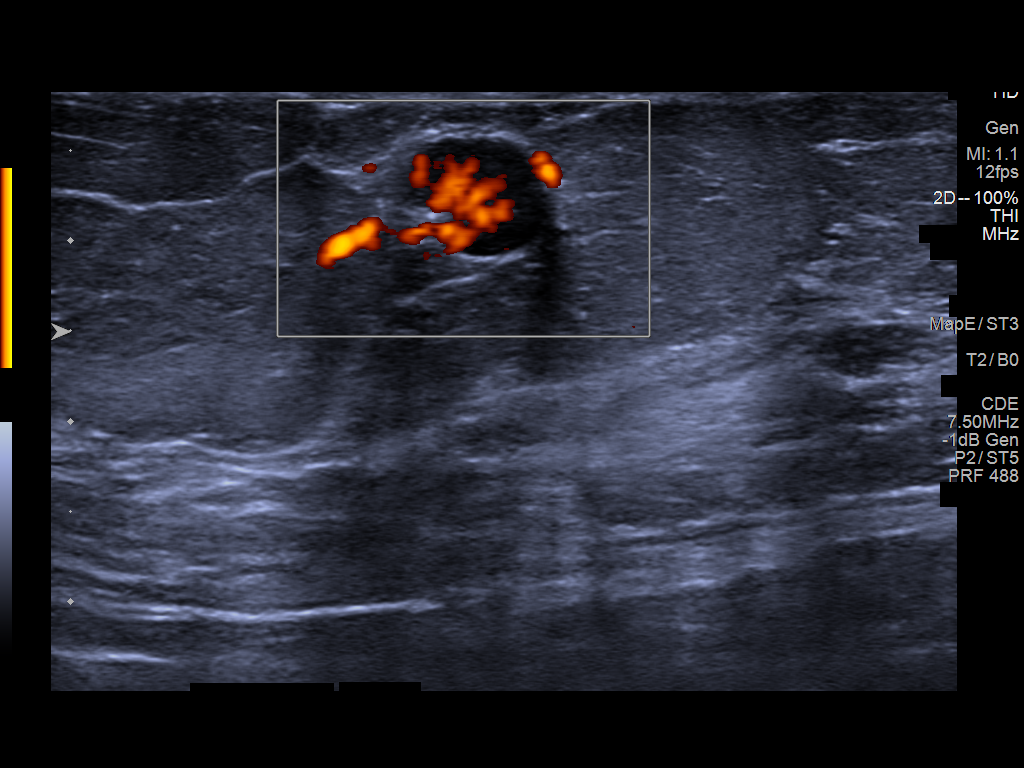

[6 of 6 positions shown; findings below may reference images not displayed]

ACR Breast Density Category b: There are scattered areas of
fibroglandular density.
FINDINGS: The known lymph node in the lateral right breast is smaller compared
to 3195 but unchanged since 6730. A focal asymmetry/subtle mass in
the anterior right breast just lateral to the nipple is unchanged
and considered benign as well. There are lymph nodes in the inferior
right breast which are smaller compared to 3195. Asymmetries in the
left breast resolve on additional imaging. No new or suspicious
findings identified in either breast.

Targeted ultrasound is performed, showing the previously identified
lymph node without significant change. The cortex remains prominent
suggesting a reactive node. The patient has hidradenitis, likely
explaining the prominent node. No other suspicious findings in the
right breast.
IMPRESSION: The patient has a continued prominent node in the lateral right
breast which is smaller compared to 3195 and unchanged since 6730,
considered benign. This is likely a mildly reactive nodes given the
history of hidradenitis. No other suspicious findings in either
breast.

RECOMMENDATION:
Recommend annual screening mammography beginning at the age of 40.

I have discussed the findings and recommendations with the patient.
If applicable, a reminder letter will be sent to the patient
regarding the next appointment.

BI-RADS CATEGORY  2: Benign.

## 2022-05-16 NOTE — Telephone Encounter (Signed)
Error encounter. 

## 2022-06-21 ENCOUNTER — Telehealth: Payer: 59 | Admitting: Physician Assistant

## 2022-06-21 DIAGNOSIS — H1033 Unspecified acute conjunctivitis, bilateral: Secondary | ICD-10-CM | POA: Diagnosis not present

## 2022-06-21 MED ORDER — POLYMYXIN B-TRIMETHOPRIM 10000-0.1 UNIT/ML-% OP SOLN: OPHTHALMIC | 0 refills | Status: DC

## 2022-06-21 NOTE — Progress Notes (Signed)
Virtual Visit Consent   Katie Medina, you are scheduled for a virtual visit with a [facility] provider today. Just as with appointments in the office, your consent must be obtained to participate. Your consent will be active for this visit and any virtual visit you may have with one of our providers in the next 365 days. If you have a MyChart account, a copy of this consent can be sent to you electronically.  As this is a virtual visit, video technology does not allow for your provider to perform a traditional examination. This may limit your provider's ability to fully assess your condition. If your provider identifies any concerns that need to be evaluated in person or the need to arrange testing (such as labs, EKG, etc.), we will make arrangements to do so. Although advances in technology are sophisticated, we cannot ensure that it will always work on either your end or our end. If the connection with a video visit is poor, the visit may have to be switched to a telephone visit. With either a video or telephone visit, we are not always able to ensure that we have a secure connection.  By engaging in this virtual visit, you consent to the provision of healthcare and authorize for your insurance to be billed (if applicable) for the services provided during this visit. Depending on your insurance coverage, you may receive a charge related to this service.  I need to obtain your verbal consent now. Are you willing to proceed with your visit today? LORRA FREEMAN has provided verbal consent on 06/21/2022 for a virtual visit (video or telephone). Leeanne Rio, [place]  Date: 06/21/2022 7:51 AM  Virtual Visit via Video Note   I, Leeanne Rio, connected with  Katie Medina  ([id number], [birth year]) on 06/21/22 at  7:45 AM EDT by a video-enabled telemedicine application and verified that I am speaking with the correct person using two identifiers.  Location: Patient: Virtual Visit  Location Patient: Home Provider: Virtual Visit Location Provider: Home Office   I discussed the limitations of evaluation and management by telemedicine and the availability of in person appointments. The patient expressed understanding and agreed to proceed.    History of Present Illness: Katie Medina is a 36 y.o. who identifies as a female who was assigned female at birth, and is being seen today for bilateral red eye and eye irritation. Notes that on Sunday, waking up each morning with discharge. Some discharge throughout the day -- more so with L > R. Denies vision change. Denies fever, chills.Had cold last week that has resolved mainly. Just with some slight residual nasal congestion. Denies decreased or painful EOMI.   HPI: HPI  Problems:  Patient Active Problem List   Diagnosis Date Noted   Lower back pain 12/21/2020   Abdominal pain 12/07/2020   Elevated blood pressure reading 11/03/2020   Depression with anxiety 11/03/2020   Chronic nonintractable headache 04/14/2019   Sinus pressure 04/14/2019   Chronic GERD 07/22/2018   Constipation 07/22/2018   PCOS (polycystic ovarian syndrome) 07/22/2018   Hyperlipidemia 07/22/2018   Hidradenitis suppurativa 07/22/2018   Health care maintenance 07/22/2018   Obesity 07/22/2018   Anemia    AV block, 1st degree    Fibroids 07/19/2012    Class: Chronic    Allergies:  Allergies  Allergen Reactions   Penicillins     Unknown reaction Tolerated 1st generation cephalosporin (cefazolin) on 07/17/2012 - B.Pearline Cables, FNP-C   Percocet [Oxycodone-Acetaminophen] Other (  See Comments)    Causes back pain.   Medications:  Current Outpatient Medications:    trimethoprim-polymyxin b (POLYTRIM) ophthalmic solution, Apply 1-2 drops into affected eye QID x 5 days., Disp: 10 mL, Rfl: 0   acetaminophen (TYLENOL) 325 MG tablet, Take 650 mg by mouth every 6 (six) hours as needed for moderate pain or headache., Disp: , Rfl:    Cholecalciferol (VITAMIN  D3 PO), Take 1 tablet by mouth daily., Disp: , Rfl:    docusate sodium (COLACE) 100 MG capsule, Take 1 capsule (100 mg total) by mouth 2 (two) times daily as needed., Disp: 30 capsule, Rfl: 2   drospirenone-ethinyl estradiol (YAZ) 3-0.02 MG tablet, Take 1 tablet by mouth daily., Disp: 84 tablet, Rfl: 0   fluticasone (FLONASE) 50 MCG/ACT nasal spray, Place 1 spray into both nostrils daily for 14 days. (Patient taking differently: Place 1 spray into both nostrils daily as needed for allergies.), Disp: 16 g, Rfl: 0   ibuprofen (ADVIL) 600 MG tablet, Take 1 tablet (600 mg total) by mouth every 6 (six) hours as needed., Disp: 60 tablet, Rfl: 3   Iron-FA-B Cmp-C-Biot-Probiotic (FUSION PLUS PO), Take 2 tablets by mouth daily., Disp: , Rfl:    multivitamin-lutein (OCUVITE-LUTEIN) CAPS capsule, Take 1 capsule by mouth once a week., Disp: , Rfl:    naproxen (NAPROSYN) 500 MG tablet, Take 1 tablet (500 mg total) by mouth 2 (two) times daily with a meal., Disp: 30 tablet, Rfl: 0   OVER THE COUNTER MEDICATION, Take 1 Scoop by mouth daily. Calcium Carbonate Powder for acid reflux, mix with water and drink, Disp: , Rfl:    OVER THE COUNTER MEDICATION, Take 3 capsules by mouth daily as needed (first 3 days of menses). [employer]: Endocrine and Ovarian Support, Disp: , Rfl:    Phenylephrine-Acetaminophen (TYLENOL SINUS+HEADACHE) 5-325 MG TABS, Take 1 tablet by mouth daily as needed (sinus headache)., Disp: , Rfl:    simethicone (GAS-X) 80 MG chewable tablet, Chew 1 tablet (80 mg total) by mouth 4 (four) times daily as needed for flatulence., Disp: 30 tablet, Rfl: 2  Observations/Objective: Patient is well-developed, well-nourished in no acute distress.  Resting comfortably at home.  Head is normocephalic, atraumatic.  No labored breathing. Speech is clear and coherent with logical content.  Patient is alert and oriented at baseline.  Mild bilateral conjunctival injection noted. EOMI. Pupils are equal  and round. No eyelid swelling or lesion noted on visual examination.  Assessment and Plan: 1. Acute bacterial conjunctivitis of both eyes - trimethoprim-polymyxin b (POLYTRIM) ophthalmic solution; Apply 1-2 drops into affected eye QID x 5 days.  Dispense: 10 mL; Refill: 0  Supportive measures and OTC medications reviewed. Start polytrim OP. Follow-up in-person if not resolving.   Follow Up Instructions: I discussed the assessment and treatment plan with the patient. The patient was provided an opportunity to ask questions and all were answered. The patient agreed with the plan and demonstrated an understanding of the instructions.  A copy of instructions were sent to the patient via MyChart unless otherwise noted below.   The patient was advised to call back or seek an in-person evaluation if the symptoms worsen or if the condition fails to improve as anticipated.  Time:  I spent 10 minutes with the patient via telehealth technology discussing the above problems/concerns.    Leeanne Rio, PA-C

## 2022-06-21 NOTE — Patient Instructions (Signed)
Katie Medina, thank you for joining Leeanne Rio, PA-C for today's virtual visit.  While this provider is not your primary care provider (PCP), if your PCP is located in our provider database this encounter information will be shared with them immediately following your visit.  Consent: (Patient) Katie Medina provided verbal consent for this virtual visit at the beginning of the encounter.  Current Medications:  Current Outpatient Medications:    acetaminophen (TYLENOL) 325 MG tablet, Take 650 mg by mouth every 6 (six) hours as needed for moderate pain or headache., Disp: , Rfl:    Cholecalciferol (VITAMIN D3 PO), Take 1 tablet by mouth daily., Disp: , Rfl:    docusate sodium (COLACE) 100 MG capsule, Take 1 capsule (100 mg total) by mouth 2 (two) times daily as needed., Disp: 30 capsule, Rfl: 2   drospirenone-ethinyl estradiol (YAZ) 3-0.02 MG tablet, Take 1 tablet by mouth daily., Disp: 84 tablet, Rfl: 0   fluticasone (FLONASE) 50 MCG/ACT nasal spray, Place 1 spray into both nostrils daily for 14 days. (Patient taking differently: Place 1 spray into both nostrils daily as needed for allergies.), Disp: 16 g, Rfl: 0   ibuprofen (ADVIL) 600 MG tablet, Take 1 tablet (600 mg total) by mouth every 6 (six) hours as needed., Disp: 60 tablet, Rfl: 3   Iron-FA-B Cmp-C-Biot-Probiotic (FUSION PLUS PO), Take 2 tablets by mouth daily., Disp: , Rfl:    multivitamin-lutein (OCUVITE-LUTEIN) CAPS capsule, Take 1 capsule by mouth once a week., Disp: , Rfl:    naproxen (NAPROSYN) 500 MG tablet, Take 1 tablet (500 mg total) by mouth 2 (two) times daily with a meal., Disp: 30 tablet, Rfl: 0   NEOMYCIN-POLYMYXIN-HYDROCORTISONE (CORTISPORIN) 1 % SOLN OTIC solution, Place 3 drops into the left ear 4 (four) times daily. For 5-7 days, Disp: 10 mL, Rfl: 0   OVER THE COUNTER MEDICATION, Take 1 Scoop by mouth daily. Calcium Carbonate Powder for acid reflux, mix with water and drink, Disp: , Rfl:    OVER THE  COUNTER MEDICATION, Take 3 capsules by mouth daily as needed (first 3 days of menses). [employer]: Endocrine and Ovarian Support, Disp: , Rfl:    Phenylephrine-Acetaminophen (TYLENOL SINUS+HEADACHE) 5-325 MG TABS, Take 1 tablet by mouth daily as needed (sinus headache)., Disp: , Rfl:    simethicone (GAS-X) 80 MG chewable tablet, Chew 1 tablet (80 mg total) by mouth 4 (four) times daily as needed for flatulence., Disp: 30 tablet, Rfl: 2   Medications ordered in this encounter:  No orders of the defined types were placed in this encounter.    *If you need refills on other medications prior to your next appointment, please contact your pharmacy*  Follow-Up: Call back or seek an in-person evaluation if the symptoms worsen or if the condition fails to improve as anticipated.  Other Instructions Please keep hands washed. Avoid touching the face/eyes. Apply warm compresses for 10-15 minutes a few times daily as directed. Continue wearing glasses until resolved. Use antibiotic drops as directed. If not resolving or if you note any new/worsening symptoms, please seek in-person evaluation.    If you have been instructed to have an in-person evaluation today at a local Urgent Care facility, please use the link below. It will take you to a list of all of our available [facility], including address, phone number and hours of operation. Please do not delay care.  [facility]  If you or a family member do not  have a primary care provider, use the link below to schedule a visit and establish care. When you choose a [facility] primary care physician or advanced practice provider, you gain a long-term partner in health. Find a Primary Care Provider  Learn more about [facility] in-office and virtual care options: [facility]

## 2022-08-03 ENCOUNTER — Other Ambulatory Visit: Payer: Self-pay

## 2022-08-03 ENCOUNTER — Encounter: Payer: Self-pay | Admitting: Internal Medicine

## 2022-08-03 ENCOUNTER — Ambulatory Visit: Payer: 59 | Admitting: Internal Medicine

## 2022-08-03 DIAGNOSIS — L732 Hidradenitis suppurativa: Secondary | ICD-10-CM | POA: Diagnosis not present

## 2022-08-03 MED ORDER — CLINDAMYCIN PHOSPHATE 1 % EX GEL: Freq: Two times a day (BID) | CUTANEOUS | 0 refills | Status: DC

## 2022-08-03 NOTE — Progress Notes (Signed)
   CC: Hidradenitis suppurativa lesion  HPI:  [name] is a 36 y.o. person with past medical history as detailed below who presents today with a lesion on her buttocks. Please see problem based charting for detailed assessment and plan.  Past Medical History:  Diagnosis Date   Anemia    otc iron supplement   AV block, 1st degree    Fibroid    GERD (gastroesophageal reflux disease)    otc reflux reducer prn-has not used in last month-assoc with foods   Infection    UTI   Review of Systems:  Negative unless otherwise stated.  Physical Exam:  Vitals:   08/03/22 0958  BP: (!) 142/80  Pulse: 89  Temp: 98.3 F (36.8 C)  TempSrc: Oral  SpO2: 100%  Weight: 232 lb 3.2 oz (105.3 kg)  Height: '5\' 4"'$  (1.626 m)   Physical Exam Exam conducted with a chaperone present.  Constitutional:      General: She is not in acute distress.    Appearance: She is not ill-appearing.  Cardiovascular:     Rate and Rhythm: Normal rate and regular rhythm.  Pulmonary:     Effort: Pulmonary effort is normal. No respiratory distress.  Genitourinary:    Comments: Fluctuant lesion within intergluteal fold on R with 5m opening on surface of lesion. Draining serosanguineous combined with purulent fluid. Mild tenderness to palpation. No increased warmth, erythema, edema.  Musculoskeletal:     Left lower leg: No edema.  Skin:    General: Skin is warm and dry.  Neurological:     General: No focal deficit present.     Mental Status: She is oriented to person, place, and time.  Psychiatric:        Mood and Affect: Mood normal.     Assessment & Plan:   See Encounters Tab for problem based charting.  Hidradenitis suppurativa Patient presents with a lesion within the intergluteal fold on the right for 1 week that has been painful, and now has been draining since last night with less pain.  She has a history of hidradenitis suppurativa and states that her last lesion in this region was when  she was around 36years old.  Typically she gets lesions within her axilla or under her breasts.  She states that she ordered ichthammol ointment from the Internet and used it for the first time last night.  After applying the cream she felt a release of pressure from the lesion and noted to be draining.  She is not having fever or chills.  She is keeping the area clean, covered, and dry with a dressing.  The one that she had currently applied prior to this visit was wet from drainage on inspection.  On exam the lesion is draining a combination of serosanguineous and purulent fluid.  There is a very small, 3 mm opening at the surface of the lesion.  No increased warmth, erythema, edema surrounding the lesion.  Assessment: Lesion is self draining with no concern of acute infection at this time.  She will need antibiotic treatment over the area. Plan: Patient instructed to apply clindamycin cream twice daily for 1 week over the affected area.  She has been instructed to keep it clean and dry, and to use a dressing while it is draining.  Patient discussed with Dr. GPhilipp Ovens

## 2022-08-03 NOTE — Assessment & Plan Note (Signed)
Patient presents with a lesion within the intergluteal fold on the right for 1 week that has been painful, and now has been draining since last night with less pain.  She has a history of hidradenitis suppurativa and states that her last lesion in this region was when she was around 36 years old.  Typically she gets lesions within her axilla or under her breasts.  She states that she ordered ichthammol ointment from the Internet and used it for the first time last night.  After applying the cream she felt a release of pressure from the lesion and noted to be draining.  She is not having fever or chills.  She is keeping the area clean, covered, and dry with a dressing.  The one that she had currently applied prior to this visit was wet from drainage on inspection.  On exam the lesion is draining a combination of serosanguineous and purulent fluid.  There is a very small, 3 mm opening at the surface of the lesion.  No increased warmth, erythema, edema surrounding the lesion.  Assessment: Lesion is self draining with no concern of acute infection at this time.  She will need antibiotic treatment over the area. Plan: Patient instructed to apply clindamycin cream twice daily for 1 week over the affected area.  She has been instructed to keep it clean and dry, and to use a dressing while it is draining.

## 2022-08-03 NOTE — Patient Instructions (Signed)
Ms. Woodson,  Today we addressed the hidradenitis suppurativa lesion on your buttocks. It is draining well and does not look infected. I would like you to keep the area covered, especially while it is draining. Keep this area clean and apply clindamycin cream to the area twice daily for one week.  If you develop a fever or chills, or the area gets larger or more painful, please contact our clinic.  My best, Dr. Marlou Sa

## 2022-08-07 NOTE — Progress Notes (Signed)
Internal Medicine Clinic Attending ? ?Case discussed with Dr. Dean  At the time of the visit.  We reviewed the resident?s history and exam and pertinent patient test results.  I agree with the assessment, diagnosis, and plan of care documented in the resident?s note.  ?

## 2022-09-13 ENCOUNTER — Telehealth: Payer: 59 | Admitting: Physician Assistant

## 2022-09-13 DIAGNOSIS — H00014 Hordeolum externum left upper eyelid: Secondary | ICD-10-CM | POA: Diagnosis not present

## 2022-09-13 NOTE — Progress Notes (Signed)
  E-Visit for Stye   We are sorry that you are not feeling well. Here is how we plan to help!  Based on what you have shared with me it looks like you have a stye.  A stye is an inflammation of the eyelid.  It is often a red, painful lump near the edge of the eyelid that may look like a boil or a pimple.  A stye develops when an infection occurs at the base of an eyelash.   We have made appropriate suggestions for you based upon your presentation: Simple styes can be treated without medical intervention.  Most styes either resolve spontaneously or resolve with simple home treatment by applying warm compresses or heated washcloth to the stye for about 10-15 minutes three to four times a day. This causes the stye to drain and resolve.  HOME CARE:  Wash your hands often! Let the stye open on its own. Don't squeeze or open it. Don't rub your eyes. This can irritate your eyes and let in bacteria.  If you need to touch your eyes, wash your hands first. Don't wear eye makeup or contact lenses until the area has healed.  GET HELP RIGHT AWAY IF:  Your symptoms do not improve. You develop blurred or loss of vision. Your symptoms worsen (increased discharge, pain or redness).   Thank you for choosing an e-visit.  Your e-visit answers were reviewed by a board certified advanced clinical practitioner to complete your personal care plan. Depending upon the condition, your plan could have included both over the counter or prescription medications.  Please review your pharmacy choice. Make sure the pharmacy is open so you can pick up prescription now. If there is a problem, you may contact your provider through [employer] and have the prescription routed to another pharmacy.  Your safety is important to [place]. If you have drug allergies check your prescription carefully.   For the next 24 hours you can use MyChart to ask questions about today's visit, request a non-urgent call back, or ask for a  work or school excuse. You will get an email in the next two days asking about your experience. I hope that your e-visit has been valuable and will speed your recovery.

## 2022-09-13 NOTE — Progress Notes (Signed)
I have spent 5 minutes in review of e-visit questionnaire, review and updating patient chart, medical decision making and response to patient.   Puja Caffey Cody Evyn Putzier, PA-C    

## 2022-10-04 ENCOUNTER — Ambulatory Visit: Payer: 59 | Admitting: Podiatry

## 2022-10-04 DIAGNOSIS — B351 Tinea unguium: Secondary | ICD-10-CM | POA: Diagnosis not present

## 2022-10-04 MED ORDER — TERBINAFINE HCL 250 MG PO TABS: 250.0000 mg | ORAL_TABLET | Freq: Every day | ORAL | 0 refills | Status: AC

## 2022-10-07 NOTE — Progress Notes (Signed)
  Subjective:  Patient ID: Katie Medina, female    DOB: 1986/06/10,  MRN: [id number]  Chief Complaint  Patient presents with   Nail Problem    (np) bilateral fungus, great toes    36 y.o. female presents with the above complaint. History confirmed with patient.  Has been developing recently with discoloration and splitting of the nail that developed after having acrylic nail on this  Objective:  Physical Exam: warm, good capillary refill, no trophic changes or ulcerative lesions, normal DP and PT pulses, and normal sensory exam.  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 Onychomycosis      Plan:  Patient was evaluated and treated and all questions answered.  Suspect she likely is developing onychomycosis and advised her to avoid clinical gel nails for now and leave open to air.  Rx for Lamisil 90 [place] to pharmacy if we discussed treatment options both oral and topical.  I will see her back in 4 months for follow-up photographs were taken.  Return in about 4 months (around 02/04/2023) for follow up after nail fungus treatment.

## 2022-12-07 ENCOUNTER — Encounter: Payer: Self-pay | Admitting: Podiatry

## 2023-02-04 ENCOUNTER — Ambulatory Visit: Payer: 59 | Admitting: Podiatry

## 2023-02-05 ENCOUNTER — Ambulatory Visit: Payer: 59 | Admitting: Podiatry

## 2023-02-05 VITALS — BP 137/87 | HR 73

## 2023-02-05 DIAGNOSIS — B351 Tinea unguium: Secondary | ICD-10-CM | POA: Diagnosis not present

## 2023-02-05 NOTE — Patient Instructions (Signed)
Look for urea [id number] with 2% salicylic acid cream or ointment and apply to the thickened dry skin / calluses. This can be bought over the counter, at a pharmacy or online such as [employer].

## 2023-02-07 MED ORDER — TERBINAFINE HCL 250 MG PO TABS: 250.0000 mg | ORAL_TABLET | Freq: Every day | ORAL | 0 refills | Status: DC

## 2023-02-07 NOTE — Progress Notes (Signed)
  Subjective:  Patient ID: Katie Medina, female    DOB: 03-12-1986,  MRN: [id number]  Chief Complaint  Patient presents with   Nail Problem    Patient reports taking Lamisil as directed. Can see some improvement in the appearance of the nails, they are about half way grown out at this time.    37 y.o. female presents with the above complaint. History confirmed with patient.  Says it is doing much better had no problems taking the Lamisil  Objective:  Physical Exam: warm, good capillary refill, no trophic changes or ulcerative lesions, normal DP and PT pulses, and normal sensory exam.  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 Onychomycosis       Plan:  Patient was evaluated and treated and all questions answered.  Doing well and responding well to Lamisil administration with proximal clearing.  Recommended a second course of this.  Sent to pharmacy.  Return in 3 months for follow-up and further administration if needed Return in about 3 months (around 05/06/2023) for follow up after nail fungus treatment.

## 2023-03-07 ENCOUNTER — Other Ambulatory Visit: Payer: Self-pay | Admitting: Podiatry

## 2023-05-07 ENCOUNTER — Ambulatory Visit: Payer: 59 | Admitting: Podiatry

## 2023-05-21 ENCOUNTER — Ambulatory Visit: Payer: 59 | Admitting: Podiatry

## 2023-05-21 DIAGNOSIS — B351 Tinea unguium: Secondary | ICD-10-CM | POA: Diagnosis not present

## 2023-05-21 MED ORDER — TERBINAFINE HCL 250 MG PO TABS: 250.0000 mg | ORAL_TABLET | Freq: Every day | ORAL | 2 refills | Status: DC

## 2023-05-23 NOTE — Progress Notes (Signed)
  Subjective:  Patient ID: Katie Medina, female    DOB: 23-Nov-1986,  MRN: [id number]  Chief Complaint  Patient presents with   Nail Problem    Rm 4 Follow up bilateral nail fungus. Pt was on Terbinafine and believes it helped with her nail appearance. Pt is now concern with dark discoloration of her right 5th toe nail.     37 y.o. female presents with the above complaint. History confirmed with patient.  She is doing very well with almost completely cleared  Objective:  Physical Exam: warm, good capillary refill, no trophic changes or ulcerative lesions, normal DP and PT pulses, and normal sensory exam.  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 Onychomycosis        Plan:  Patient was evaluated and treated and all questions answered.  Doing very well.  Final course of Lamisil sent to pharmacy.  Has about 90% clearance.  Expect this last course will eliminate with time as it grows out.  Return as needed if it worsens or does not improve  Return if symptoms worsen or fail to improve.

## 2023-05-28 ENCOUNTER — Encounter: Payer: Self-pay | Admitting: Student

## 2023-11-06 ENCOUNTER — Other Ambulatory Visit: Payer: Self-pay | Admitting: Podiatry

## 2024-01-17 ENCOUNTER — Telehealth: Payer: 59 | Admitting: Family Medicine

## 2024-01-17 DIAGNOSIS — N390 Urinary tract infection, site not specified: Secondary | ICD-10-CM

## 2024-01-17 MED ORDER — NITROFURANTOIN MONOHYD MACRO 100 MG PO CAPS: 100.0000 mg | ORAL_CAPSULE | Freq: Two times a day (BID) | ORAL | 0 refills | Status: AC

## 2024-01-17 NOTE — Progress Notes (Signed)

## 2024-01-24 ENCOUNTER — Telehealth: Payer: 59 | Admitting: Family Medicine

## 2024-01-24 DIAGNOSIS — N390 Urinary tract infection, site not specified: Secondary | ICD-10-CM

## 2024-01-24 NOTE — Progress Notes (Signed)
  Because Ms. Katie Medina, I feel your condition warrants further evaluation and I recommend that you be seen in a face-to-face visit.   NOTE: There will be NO CHARGE for this E-Visit   If you are having a true medical emergency, please call 911.     For an urgent face to face visit, [name] has multiple urgent care centers for your convenience.  Click the link below for the full list of locations and hours, walk-in wait times, appointment scheduling options and driving directions:  Urgent Care - [place], [place], [place], [place], [place], [place]  [place]     Your MyChart E-visit questionnaire answers were reviewed by a board certified advanced clinical practitioner to complete your personal care plan based on your specific symptoms.    Thank you for using e-Visits.

## 2024-04-08 ENCOUNTER — Other Ambulatory Visit: Payer: Self-pay | Admitting: Family Medicine

## 2024-04-08 DIAGNOSIS — N6310 Unspecified lump in the right breast, unspecified quadrant: Secondary | ICD-10-CM

## 2024-04-13 ENCOUNTER — Encounter

## 2024-04-13 ENCOUNTER — Other Ambulatory Visit

## 2024-04-24 ENCOUNTER — Ambulatory Visit: Admission: RE | Admit: 2024-04-24 | Source: Ambulatory Visit

## 2024-04-24 ENCOUNTER — Ambulatory Visit
Admission: RE | Admit: 2024-04-24 | Discharge: 2024-04-24 | Disposition: A | Discharge status: Home / Self Care | Source: Ambulatory Visit | Attending: Family Medicine | Admitting: Family Medicine

## 2024-04-24 DIAGNOSIS — N6310 Unspecified lump in the right breast, unspecified quadrant: Secondary | ICD-10-CM

## 2024-05-27 ENCOUNTER — Emergency Department (HOSPITAL_BASED_OUTPATIENT_CLINIC_OR_DEPARTMENT_OTHER)
Admission: EM | Admit: 2024-05-27 | Discharge: 2024-05-27 | Disposition: A | Discharge status: Home / Self Care | Attending: Emergency Medicine | Admitting: Emergency Medicine

## 2024-05-27 ENCOUNTER — Encounter (HOSPITAL_BASED_OUTPATIENT_CLINIC_OR_DEPARTMENT_OTHER): Payer: Self-pay

## 2024-05-27 ENCOUNTER — Other Ambulatory Visit: Payer: Self-pay

## 2024-05-27 ENCOUNTER — Emergency Department (HOSPITAL_BASED_OUTPATIENT_CLINIC_OR_DEPARTMENT_OTHER)

## 2024-05-27 DIAGNOSIS — R55 Syncope and collapse: Secondary | ICD-10-CM | POA: Diagnosis present

## 2024-05-27 DIAGNOSIS — D72829 Elevated white blood cell count, unspecified: Secondary | ICD-10-CM | POA: Diagnosis not present

## 2024-05-27 LAB — CBC
HCT: 33.3 % — ABNORMAL LOW (ref 36.0–46.0)
Hemoglobin: 10.1 g/dL — ABNORMAL LOW (ref 12.0–15.0)
MCH: 23.7 pg — ABNORMAL LOW (ref 26.0–34.0)
MCHC: 30.3 g/dL (ref 30.0–36.0)
MCV: 78.2 fL — ABNORMAL LOW (ref 80.0–100.0)
Platelets: 345 10*3/uL (ref 150–400)
RBC: 4.26 MIL/uL (ref 3.87–5.11)
RDW: 19.3 % — ABNORMAL HIGH (ref 11.5–15.5)
WBC: 11.8 10*3/uL — ABNORMAL HIGH (ref 4.0–10.5)
nRBC: 0 % (ref 0.0–0.2)

## 2024-05-27 LAB — COMPREHENSIVE METABOLIC PANEL WITH GFR
ALT: 9 U/L (ref 0–44)
AST: 18 U/L (ref 15–41)
Albumin: 4 g/dL (ref 3.5–5.0)
Alkaline Phosphatase: 78 U/L (ref 38–126)
Anion gap: 10 (ref 5–15)
BUN: 9 mg/dL (ref 6–20)
CO2: 26 mmol/L (ref 22–32)
Calcium: 9.1 mg/dL (ref 8.9–10.3)
Chloride: 103 mmol/L (ref 98–111)
Creatinine, Ser: 0.95 mg/dL (ref 0.44–1.00)
GFR, Estimated: >60 mL/min (ref >60)
Glucose, Bld: 116 mg/dL — ABNORMAL HIGH (ref 70–99)
Potassium: 3.6 mmol/L (ref 3.5–5.1)
Sodium: 139 mmol/L (ref 135–145)
Total Bilirubin: <0.2 mg/dL (ref 0.0–1.2)
Total Protein: 7.4 g/dL (ref 6.5–8.1)

## 2024-05-27 LAB — URINALYSIS, ROUTINE W REFLEX MICROSCOPIC
Bacteria, UA: NONE SEEN
RBC / HPF: >50 RBC/hpf (ref 0–5)

## 2024-05-27 LAB — PREGNANCY, URINE: Preg Test, Ur: NEGATIVE

## 2024-05-27 NOTE — ED Notes (Signed)
CBG 91 

## 2024-05-27 NOTE — Discharge Instructions (Signed)
 You were seen for passing out in the emergency department.   At home, please stay well-hydrated.    Check your MyChart online for the results of any tests that had not resulted by the time you left the emergency department.   Follow-up with your primary doctor in 2-3 days regarding your visit.    Return immediately to the emergency department if you experience any of the following: Recurrent loss of consciousness, or any other concerning symptoms.    Thank you for visiting our Emergency Department. It was a pleasure taking care of you today.

## 2024-05-27 NOTE — ED Provider Notes (Signed)
 [name] AT [facility] Provider Note   CSN: [id number] Arrival date & time: 05/27/24  2113     History {Add pertinent medical, surgical, social history, OB history to HPI:1} Chief Complaint  Patient presents with   Loss of Consciousness    Katie Medina is a 38 y.o. female.  38 year old female with history of IBS presents to the emergency department with loss of consciousness.  Patient reports that she occasionally has flareups of her IBS and has episodes of nausea and vomiting.  This happened tonight and after vomiting her husband is with her and noticed that her eyes rolled back in her head and her arms tensed up for approximately 5 seconds and she was not responding.  No confusion afterwards.  No tongue bite or bowel or bladder incontinence.  The patient reports that she could hear her husband but was just not able to respond because she was feeling poorly.  Reports that her abdominal pain and nausea is gone away.  No headache.  No heavy alcohol use or drug use including marijuana.       Home Medications Prior to Admission medications   Medication Sig Start Date End Date Taking? Authorizing Provider  acetaminophen  (TYLENOL ) 325 MG tablet Take 650 mg by mouth every 6 (six) hours as needed for moderate pain or headache.    [provider]  Cholecalciferol  (VITAMIN D3 PO) Take 1 tablet by mouth daily.    [provider]  clindamycin  (CLINDAGEL) 1 % gel Apply topically 2 (two) times daily. 08/03/22   Malen Scudder, DO  docusate sodium  (COLACE) 100 MG capsule Take 1 capsule (100 mg total) by mouth 2 (two) times daily as needed. 07/03/21   Teresa Fender, MD  drospirenone -ethinyl estradiol  (YAZ) 3-0.02 MG tablet Take 1 tablet by mouth daily. 01/24/22   Teresa Fender, MD  fluticasone  (FLONASE ) 50 MCG/ACT nasal spray Place 1 spray into both nostrils daily for 14 days. Patient taking differently: Place 1 spray into both nostrils daily as needed  for allergies. 10/11/20 07/12/21  Avegno, Komlanvi S, FNP  ibuprofen  (ADVIL ) 600 MG tablet Take 1 tablet (600 mg total) by mouth every 6 (six) hours as needed. 07/03/21   Teresa Fender, MD  Iron -FA-B Cmp-C-Biot-Probiotic (FUSION PLUS PO) Take 2 tablets by mouth daily.    [provider]  multivitamin-lutein (OCUVITE-LUTEIN) CAPS capsule Take 1 capsule by mouth once a week.    [provider]  naproxen  (NAPROSYN ) 500 MG tablet Take 1 tablet (500 mg total) by mouth 2 (two) times daily with a meal. 05/02/22   Farris Hong, PA-C  OVER THE COUNTER MEDICATION Take 1 Scoop by mouth daily. Calcium Carbonate Powder for acid reflux, mix with water and drink    [provider]  OVER THE COUNTER MEDICATION Take 3 capsules by mouth daily as needed (first 3 days of menses). [employer]: Endocrine and Ovarian Support    [provider]  Phenylephrine -Acetaminophen  (TYLENOL  SINUS+HEADACHE) 5-325 MG TABS Take 1 tablet by mouth daily as needed (sinus headache).    [provider]  simethicone  (GAS-X) 80 MG chewable tablet Chew 1 tablet (80 mg total) by mouth 4 (four) times daily as needed for flatulence. 07/03/21 07/03/22  Teresa Fender, MD  terbinafine  (LAMISIL ) 250 MG tablet TAKE 1 TABLET BY MOUTH EVERY DAY 11/06/23   McDonald, Adam R, DPM  trimethoprim -polymyxin b  (POLYTRIM ) ophthalmic solution Apply 1-2 drops into affected eye QID x 5 days. 06/21/22   Jonn Nett  C, PA-C      Allergies    Penicillins and Percocet [oxycodone -acetaminophen ]    Review of Systems   Review of Systems  Physical Exam Updated Vital Signs BP (!) 147/98 (BP Location: Right Arm)   Pulse 87   Temp 97.6 F (36.4 C)   Resp 20   Ht 5\' 4"  (1.626 m)   Wt 92.5 kg   SpO2 100%   BMI 35.02 kg/m  Physical Exam Vitals and nursing note reviewed.  Constitutional:      General: She is not in acute distress.    Appearance: She is well-developed.  HENT:     Head: Normocephalic and  atraumatic.     Right Ear: External ear normal.     Left Ear: External ear normal.     Nose: Nose normal.     Mouth/Throat:     Comments: No signs of a tongue bite Eyes:     Extraocular Movements: Extraocular movements intact.     Conjunctiva/sclera: Conjunctivae normal.     Pupils: Pupils are equal, round, and reactive to light.  Cardiovascular:     Rate and Rhythm: Normal rate and regular rhythm.     Heart sounds: No murmur heard. Pulmonary:     Effort: Pulmonary effort is normal. No respiratory distress.     Breath sounds: Normal breath sounds.  Abdominal:     General: Abdomen is flat. There is no distension.     Palpations: Abdomen is soft. There is no mass.     Tenderness: There is no abdominal tenderness. There is no guarding.  Musculoskeletal:     Cervical back: Normal range of motion and neck supple.     Right lower leg: No edema.     Left lower leg: No edema.  Skin:    General: Skin is warm and dry.  Neurological:     Mental Status: She is alert and oriented to person, place, and time. Mental status is at baseline.     Cranial Nerves: No cranial nerve deficit.     Sensory: No sensory deficit.     Motor: No weakness.     Coordination: Coordination normal.     Gait: Gait normal.  Psychiatric:        Mood and Affect: Mood normal.     ED Results / Procedures / Treatments   Labs (all labs ordered are listed, but only abnormal results are displayed) Labs Reviewed  COMPREHENSIVE METABOLIC PANEL WITH GFR - Abnormal; Notable for the following components:      Result Value   Glucose, Bld 116 (*)    All other components within normal limits  CBC - Abnormal; Notable for the following components:   WBC 11.8 (*)    Hemoglobin 10.1 (*)    HCT 33.3 (*)    MCV 78.2 (*)    MCH 23.7 (*)    RDW 19.3 (*)    All other components within normal limits  URINALYSIS, ROUTINE W REFLEX MICROSCOPIC  PREGNANCY, URINE  CBG MONITORING, ED    EKG EKG  Interpretation Date/Time:  Wednesday May 27 2024 21:24:30 EDT Ventricular Rate:  84 PR Interval:  255 QRS Duration:  83 QT Interval:  367 QTC Calculation: 434 R Axis:   69  Text Interpretation: Sinus rhythm Prolonged PR interval Low voltage, precordial leads Borderline T wave abnormalities Confirmed by Shyrl Doyne [phone]) on 05/27/2024 9:34:12 PM  Radiology No results found.  Procedures Procedures  {Document cardiac monitor, telemetry assessment procedure when appropriate:1}  Medications Ordered in ED Medications - No data to display  ED Course/ Medical Decision Making/ A&P   {   Click here for ABCD2, HEART and other calculatorsREFRESH Note before signing :1}                              Medical Decision Making Amount and/or Complexity of Data Reviewed Labs: ordered. Radiology: ordered.   ***  {Document critical care time when appropriate:1} {Document review of labs and clinical decision tools ie heart score, Chads2Vasc2 etc:1}  {Document your independent review of radiology images, and any outside records:1} {Document your discussion with family members, caretakers, and with consultants:1} {Document social determinants of health affecting pt's care:1} {Document your decision making why or why not admission, treatments were needed:1} Final Clinical Impression(s) / ED Diagnoses Final diagnoses:  None    Rx / DC Orders ED Discharge Orders     None

## 2024-05-27 NOTE — ED Triage Notes (Signed)
 Pt reports Hx of IBS. Had episode tonight while going to the bathroom where her eyes started rolling back and right arm tensed up. Husband reports patient was back to responding within 1-2 minutes. Hx of anemia. Pt currently on her period, period is heavy d/t fibroids (no more than usual per patient). Pt states during these IBS episodes she normally "shakes some" but this time she heard her husband talking but he sounded far away. NIH-0. NAD noted in triage.

## 2024-06-03 ENCOUNTER — Encounter: Payer: Self-pay | Admitting: *Deleted

## 2024-06-27 ENCOUNTER — Telehealth: Admitting: Emergency Medicine

## 2024-06-27 ENCOUNTER — Encounter

## 2024-06-27 DIAGNOSIS — N898 Other specified noninflammatory disorders of vagina: Secondary | ICD-10-CM

## 2024-06-28 NOTE — Progress Notes (Signed)
  Thank you for the additional information. Because of the kind of vaginal discharge you are having, I am not able to help you by Evisit and I recommend that you be seen in a face-to-face visit.   NOTE: There will be NO CHARGE for this E-Visit   If you are having a true medical emergency, please call 911.     For an urgent face to face visit, [facility] has multiple urgent care centers for your convenience.  Click the link below for the full list of locations and hours, walk-in wait times, appointment scheduling options and driving directions:  Urgent Care - [place], [place], [place], [place], [place], [place]  [place]     Your MyChart E-visit questionnaire answers were reviewed by a board certified advanced clinical practitioner to complete your personal care plan based on your specific symptoms.    Thank you for using e-Visits.

## 2024-08-27 ENCOUNTER — Telehealth: Admitting: Physician Assistant

## 2024-08-27 DIAGNOSIS — R112 Nausea with vomiting, unspecified: Secondary | ICD-10-CM

## 2024-08-27 MED ORDER — ONDANSETRON 4 MG PO TBDP: 4.0000 mg | ORAL_TABLET | Freq: Three times a day (TID) | ORAL | 0 refills | Status: AC | PRN

## 2024-08-27 NOTE — Progress Notes (Signed)

## 2024-08-30 ENCOUNTER — Emergency Department (HOSPITAL_BASED_OUTPATIENT_CLINIC_OR_DEPARTMENT_OTHER)

## 2024-08-30 ENCOUNTER — Emergency Department (HOSPITAL_BASED_OUTPATIENT_CLINIC_OR_DEPARTMENT_OTHER)
Admission: EM | Admit: 2024-08-30 | Discharge: 2024-08-30 | Disposition: A | Discharge status: Home / Self Care | Attending: Emergency Medicine | Admitting: Emergency Medicine

## 2024-08-30 DIAGNOSIS — K5900 Constipation, unspecified: Secondary | ICD-10-CM | POA: Insufficient documentation

## 2024-08-30 DIAGNOSIS — R1084 Generalized abdominal pain: Secondary | ICD-10-CM | POA: Diagnosis present

## 2024-08-30 LAB — COMPREHENSIVE METABOLIC PANEL WITH GFR
ALT: 10 U/L (ref 0–44)
AST: 15 U/L (ref 15–41)
Albumin: 3.9 g/dL (ref 3.5–5.0)
Alkaline Phosphatase: 67 U/L (ref 38–126)
Anion gap: 11 (ref 5–15)
BUN: 6 mg/dL (ref 6–20)
CO2: 22 mmol/L (ref 22–32)
Calcium: 9.1 mg/dL (ref 8.9–10.3)
Chloride: 104 mmol/L (ref 98–111)
Creatinine, Ser: 0.82 mg/dL (ref 0.44–1.00)
GFR, Estimated: >60 mL/min (ref >60)
Glucose, Bld: 96 mg/dL (ref 70–99)
Potassium: 3.8 mmol/L (ref 3.5–5.1)
Sodium: 137 mmol/L (ref 135–145)
Total Bilirubin: 0.3 mg/dL (ref 0.0–1.2)
Total Protein: 7.2 g/dL (ref 6.5–8.1)

## 2024-08-30 LAB — URINALYSIS, ROUTINE W REFLEX MICROSCOPIC
Bilirubin Urine: NEGATIVE
Glucose, UA: NEGATIVE mg/dL
Hgb urine dipstick: NEGATIVE
Ketones, ur: NEGATIVE mg/dL
Leukocytes,Ua: NEGATIVE
Nitrite: NEGATIVE
Protein, ur: NEGATIVE mg/dL
Specific Gravity, Urine: 1.018 (ref 1.005–1.030)
pH: 6 (ref 5.0–8.0)

## 2024-08-30 LAB — CBC
HCT: 28.4 % — ABNORMAL LOW (ref 36.0–46.0)
Hemoglobin: 8.9 g/dL — ABNORMAL LOW (ref 12.0–15.0)
MCH: 25.2 pg — ABNORMAL LOW (ref 26.0–34.0)
MCHC: 31.3 g/dL (ref 30.0–36.0)
MCV: 80.5 fL (ref 80.0–100.0)
Platelets: 428 K/uL — ABNORMAL HIGH (ref 150–400)
RBC: 3.53 MIL/uL — ABNORMAL LOW (ref 3.87–5.11)
RDW: 14.7 % (ref 11.5–15.5)
WBC: 8 K/uL (ref 4.0–10.5)
nRBC: 0 % (ref 0.0–0.2)

## 2024-08-30 LAB — LIPASE, BLOOD: Lipase: 53 U/L — ABNORMAL HIGH (ref 11–51)

## 2024-08-30 LAB — PREGNANCY, URINE: Preg Test, Ur: NEGATIVE

## 2024-08-30 MED ORDER — IOHEXOL 300 MG/ML  SOLN
100.0000 mL | Freq: Once | INTRAMUSCULAR | Status: AC | PRN
  Administered 2024-08-30: 100 mL via INTRAVENOUS

## 2024-08-30 MED ORDER — SODIUM CHLORIDE 0.9 % IV BOLUS
1000.0000 mL | Freq: Once | INTRAVENOUS | Status: AC
  Administered 2024-08-30: 1000 mL via INTRAVENOUS

## 2024-08-30 NOTE — ED Triage Notes (Addendum)
 Pt 3 weeks post op Myomectomy c/o constipation despite home enema use for >1 week and LLQ pain that began last night.  Pt has not had an unassisted bowel move't since Aug 20.  Last bowel mov't with enema approx7-10 days ago.  Pt last attempted enema this morning with no relief.

## 2024-08-30 NOTE — Discharge Instructions (Signed)
 Your history, exam, workup today did not reveal an acute surgical problem.  Your CT did not show evidence of obstruction nor did it show a large amount of stool burden.  Your urine did not show an infection and your labs are otherwise reassuring and similar to prior.  We feel you are safe for discharge home but do recommend follow-up with your OB/GYN, your PCP, and recommend follow-up with an outpatient gastroenterologist to discuss how to improve your bowel regimen and constipation troubles.  Please consider starting the home MiraLAX again and take 1 capful a day.  Please rest and stay hydrated.  If any symptoms change or worsen acutely, please return to the nearest emergency department.

## 2024-08-30 NOTE — ED Notes (Signed)
 Pt discharged home and given discharge paperwork. Opportunities given for questions. Pt verbalizes understanding. PIV removed x1. Bethena Powell SAUNDERS , RN

## 2024-08-30 NOTE — ED Provider Notes (Signed)
 [place] EMERGENCY DEPARTMENT AT [facility] Provider Note   CSN: [id number] Arrival date & time: 08/30/24  0940     Patient presents with: Abdominal Pain   Katie Medina is a 38 y.o. female.   The history is provided by the patient and medical records. No language interpreter was used.  Abdominal Pain Pain location:  Generalized Pain quality: aching   Pain radiates to:  Does not radiate Pain severity:  Moderate Onset quality:  Gradual Timing:  Intermittent Progression:  Waxing and waning Chronicity:  New Context: previous surgery   Relieved by:  Nothing Worsened by:  Nothing Ineffective treatments:  None tried Associated symptoms: constipation, flatus (no flatus), nausea and vomiting   Associated symptoms: no chest pain, no chills, no cough, no diarrhea, no dysuria, no fatigue, no fever, no melena, no shortness of breath, no vaginal bleeding and no vaginal discharge        Prior to Admission medications   Medication Sig Start Date End Date Taking? Authorizing Provider  acetaminophen  (TYLENOL ) 325 MG tablet Take 650 mg by mouth every 6 (six) hours as needed for moderate pain or headache.    [provider]  Cholecalciferol  (VITAMIN D3 PO) Take 1 tablet by mouth daily.    [provider]  docusate sodium  (COLACE) 100 MG capsule Take 1 capsule (100 mg total) by mouth 2 (two) times daily as needed. 07/03/21   Connell Davies, MD  fluticasone  (FLONASE ) 50 MCG/ACT nasal spray Place 1 spray into both nostrils daily for 14 days. Patient taking differently: Place 1 spray into both nostrils daily as needed for allergies. 10/11/20 07/12/21  Avegno, Komlanvi S, FNP  ibuprofen  (ADVIL ) 600 MG tablet Take 1 tablet (600 mg total) by mouth every 6 (six) hours as needed. 07/03/21   Connell Davies, MD  Iron -FA-B Cmp-C-Biot-Probiotic (FUSION PLUS PO) Take 2 tablets by mouth daily.    [provider]  multivitamin-lutein (OCUVITE-LUTEIN) CAPS capsule Take 1  capsule by mouth once a week.    [provider]  ondansetron  (ZOFRAN -ODT) 4 MG disintegrating tablet Take 1 tablet (4 mg total) by mouth every 8 (eight) hours as needed. 08/27/24   Vivienne Delon HERO, PA-C  OVER THE COUNTER MEDICATION Take 1 Scoop by mouth daily. Calcium Carbonate Powder for acid reflux, mix with water and drink    [provider]  OVER THE COUNTER MEDICATION Take 3 capsules by mouth daily as needed (first 3 days of menses). [employer]: Endocrine and Ovarian Support    [provider]  Phenylephrine -Acetaminophen  (TYLENOL  SINUS+HEADACHE) 5-325 MG TABS Take 1 tablet by mouth daily as needed (sinus headache).    [provider]  terbinafine  (LAMISIL ) 250 MG tablet TAKE 1 TABLET BY MOUTH EVERY DAY 11/06/23   Silva Juliene SAUNDERS, DPM    Allergies: Penicillins and Percocet [oxycodone -acetaminophen ]    Review of Systems  Constitutional:  Negative for chills, fatigue and fever.  HENT:  Negative for congestion.   Eyes:  Negative for visual disturbance.  Respiratory:  Negative for cough, chest tightness, shortness of breath and wheezing.   Cardiovascular:  Negative for chest pain, palpitations and leg swelling.  Gastrointestinal:  Positive for abdominal pain, constipation, flatus (no flatus), nausea and vomiting. Negative for abdominal distention, diarrhea and melena.  Genitourinary:  Negative for dysuria, flank pain, frequency, genital sores, pelvic pain, vaginal bleeding and vaginal discharge.  Musculoskeletal:  Negative for back pain, neck pain and neck stiffness.  Skin:  Negative for rash and wound.  Neurological:  Negative for light-headedness, numbness and headaches.  Psychiatric/Behavioral:  Negative for agitation and confusion.   All other systems reviewed and are negative.   Updated Vital Signs BP 116/84   Pulse 87   Temp 98.3 F (36.8 C) (Oral)   Resp 16   LMP 08/04/2024 (Exact Date)   SpO2 100%   Physical Exam Vitals  and nursing note reviewed.  Constitutional:      General: She is not in acute distress.    Appearance: She is well-developed. She is not ill-appearing, toxic-appearing or diaphoretic.  HENT:     Head: Normocephalic and atraumatic.     Nose: No congestion or rhinorrhea.     Mouth/Throat:     Mouth: Mucous membranes are dry.     Pharynx: No oropharyngeal exudate or posterior oropharyngeal erythema.  Eyes:     Extraocular Movements: Extraocular movements intact.     Conjunctiva/sclera: Conjunctivae normal.     Pupils: Pupils are equal, round, and reactive to light.  Cardiovascular:     Rate and Rhythm: Normal rate and regular rhythm.     Heart sounds: No murmur heard. Pulmonary:     Effort: Pulmonary effort is normal. No respiratory distress.     Breath sounds: Normal breath sounds. No wheezing, rhonchi or rales.  Chest:     Chest wall: No tenderness.  Abdominal:     General: Abdomen is flat. Bowel sounds are normal. There is no distension.     Palpations: Abdomen is soft.     Tenderness: There is no abdominal tenderness. There is no right CVA tenderness, left CVA tenderness, guarding or rebound.  Musculoskeletal:        General: No swelling or tenderness.     Cervical back: Neck supple.     Right lower leg: No edema.     Left lower leg: No edema.  Skin:    General: Skin is warm and dry.     Capillary Refill: Capillary refill takes less than 2 seconds.     Findings: No erythema or rash.  Neurological:     General: No focal deficit present.     Mental Status: She is alert.  Psychiatric:        Mood and Affect: Mood normal.     (all labs ordered are listed, but only abnormal results are displayed) Labs Reviewed  LIPASE, BLOOD - Abnormal; Notable for the following components:      Result Value   Lipase 53 (*)    All other components within normal limits  CBC - Abnormal; Notable for the following components:   RBC 3.53 (*)    Hemoglobin 8.9 (*)    HCT 28.4 (*)    MCH  25.2 (*)    Platelets 428 (*)    All other components within normal limits  COMPREHENSIVE METABOLIC PANEL WITH GFR  URINALYSIS, ROUTINE W REFLEX MICROSCOPIC  PREGNANCY, URINE    EKG: None  Radiology: CT ABDOMEN PELVIS W CONTRAST Result Date: 08/30/2024 CLINICAL DATA:  Severe abdominal pain with nausea and vomiting. EXAM: CT ABDOMEN AND PELVIS WITH CONTRAST TECHNIQUE: Multidetector CT imaging of the abdomen and pelvis was performed using the standard protocol following bolus administration of intravenous contrast. RADIATION DOSE REDUCTION: This exam was performed according to the departmental dose-optimization program which includes automated exposure control, adjustment of the mA and/or kV according to patient size and/or use of iterative reconstruction technique. CONTRAST:  OMNIPAQUE  IOHEXOL  300 MG/ML  SOLN COMPARISON:  February 18, 2007.  April 06, 2021. FINDINGS: Lower chest: No acute abnormality. Hepatobiliary: No focal liver abnormality is seen. No gallstones, gallbladder wall thickening, or biliary dilatation. Pancreas: Unremarkable. No pancreatic ductal dilatation or surrounding inflammatory changes. Spleen: Normal in size without focal abnormality. Adrenals/Urinary Tract: Adrenal glands are unremarkable. Kidneys are normal, without renal calculi, focal lesion, or hydronephrosis. Bladder is unremarkable. Stomach/Bowel: Stomach is within normal limits. Appendix appears normal. No evidence of bowel wall thickening, distention, or inflammatory changes. Vascular/Lymphatic: No significant vascular findings are present. No enlarged abdominal or pelvic lymph nodes. Reproductive: Enlarged uterus with multiple fibroids is again noted. Other: No ascites or hernia is noted. Musculoskeletal: No acute or significant osseous findings. IMPRESSION: 1. Enlarged uterus with multiple fibroids is again noted. 2. No acute abnormality seen in the abdomen or pelvis. Electronically Signed   By: Lynwood Landy Raddle M.D.    On: 08/30/2024 12:55     Procedures   Medications Ordered in the ED  sodium chloride  0.9 % bolus 1,000 mL (1,000 mLs Intravenous New Bag/Given 08/30/24 1233)  iohexol  (OMNIPAQUE ) 300 MG/ML solution 100 mL (100 mLs Intravenous Contrast Given 08/30/24 1202)                                    Medical Decision Making Amount and/or Complexity of Data Reviewed Labs: ordered. Radiology: ordered.  Risk Prescription drug management.    Katie Medina is a 38 y.o. female with a past medical history significant for anemia, hyperlipidemia, PCOS, hidradenitis suppurativa, and uterine fibroids status post recent fibroid surgery last month who presents with decreased bowel movement and intermittent abdominal discomfort with some nausea and vomiting.  According to patient, she has had some vomiting for 3 days last week but the vomiting has improved.  Now she is having similar abdominals comfort and has had to use enemas to have bowel movements.  She reports she does a history of some irritable bowel syndrome and frequently needs enemas but felt like she was passing less gas over the last few days.  With her previous surgery she want make sure there is nothing concerning going on.  Denies any trauma.  Denies any fevers, chills, congestion, cough and denies any urinary changes.  On exam, lungs clear.  Chest nontender.  Abdomen was not focally tender for me.  I did hear good bowel sounds.  Flanks and back nontender.  She denied any vaginal complaints or vaginal bleeding or discharge.  Good pulses in extremities.  Patient resting comfortably.  With her recent surgery and the decrease in stool and decrease in flatus, will get a CT scan to rule out bowel obstruction or other postsurgical problem.  Will get screening labs and urine as well.     Labs returned showing mild anemia but otherwise no metabolic abnormality.  Lipase just elevated at 53.  Urinalysis shows no UTI and she is not pregnant.  CT scan  was enlarged uterus with fibroids but otherwise no bowel obstruction or other acute problem.  Does not show a large amount of stool at this time.  Will discuss with patient and anticipate discharge for outpatient follow-up.  2:56 PM Workup returned overall reassuring.  Patient CT scan does not show surgical problem or obstruction.  No evidence of large stool burden or appendicitis.  No concerning finding seen.  Patient does have evidence of fibroids but given her uterus and we discussed all these findings.  Labs otherwise  reassuring.  We had a shared decision conversation and feel she is safe for discharge home but agreed to have her follow-up with her OB/GYN, her PCP and we will give her numbers to call to follow-up with outpatient GI given her bowel regimen.  She has MiraLAX at home that she will use and will increase her hydration.  She had no other questions or concerns and was discharged in good condition after reassuring workup.      Final diagnoses:  Constipation, unspecified constipation type  Generalized abdominal pain    ED Discharge Orders     None       Clinical Impression: 1. Constipation, unspecified constipation type   2. Generalized abdominal pain     Disposition: Discharge  Condition: Good  I have discussed the results, Dx and Tx plan with the pt(& family if present). He/she/they expressed understanding and agree(s) with the plan. Discharge instructions discussed at great length. Strict return precautions discussed and pt &/or family have verbalized understanding of the instructions. No further questions at time of discharge.    New Prescriptions   No medications on file    Follow Up: Dayna Motto, DO [address] [place] [address] [phone]     [facility] [address] [place]  [id number] [phone]    Gastroenterology, [place] [address] [place]  [address] [phone]          Kingson Lohmeyer, Lonni PARAS, MD 08/30/24 1500

## 2024-09-16 ENCOUNTER — Encounter: Payer: Self-pay | Admitting: Podiatry

## 2024-09-16 ENCOUNTER — Ambulatory Visit

## 2024-09-16 ENCOUNTER — Ambulatory Visit (INDEPENDENT_AMBULATORY_CARE_PROVIDER_SITE_OTHER)

## 2024-09-16 DIAGNOSIS — M65871 Other synovitis and tenosynovitis, right ankle and foot: Secondary | ICD-10-CM | POA: Diagnosis not present

## 2024-09-16 DIAGNOSIS — M659 Unspecified synovitis and tenosynovitis, unspecified site: Secondary | ICD-10-CM | POA: Diagnosis not present

## 2024-09-16 DIAGNOSIS — M7752 Other enthesopathy of left foot: Secondary | ICD-10-CM

## 2024-09-16 DIAGNOSIS — M7751 Other enthesopathy of right foot: Secondary | ICD-10-CM

## 2024-09-16 MED ORDER — TRIAMCINOLONE ACETONIDE 10 MG/ML IJ SUSP
10.0000 mg | Freq: Once | INTRAMUSCULAR | Status: AC
  Administered 2024-09-16: 10 mg via INTRA_ARTICULAR

## 2024-09-17 NOTE — Progress Notes (Addendum)
 Subjective:   Patient ID: Katie Medina, female   DOB: 38 y.o.   MRN: [id number]   HPI Patient presents stating she is getting a lot of pain between the 4th and 5th digits right over left foot and this has been going on for a while.  She has not had this pain before and she did have history of fungus and that is not the issue.  States the right foot really bothers her left foot moderate   ROS      Objective:  Physical Exam  Neuro vascular status intact with patient found to have inflammation pain of the fourth interspace right over left foot with what appears to be bone compression in the area bilateral. This appears to be within the synovium Patient is found to have good digital perfusion with bone spur formation bilateral     Assessment:  Inflammatory inflammation with synovitis of the digits right over left with compression between the 4th and 5th toe and keratotic tissue formation bilateral with structural deformity     Plan:  H&P reviewed at this point careful steroid injection administered to the interphalange Il and the synovium tissue joint digit 5 right 1 mg dexamethasone  1 mg Kenalog  2 mg Xylocaine  and debrided lesions courtesy bilateral.  May ultimately take a partial syndactylization but we will evaluate that depending on response to conservative treatment  X-rays indicate rotation of the fifth digit pressing against the fourth toe bilateral
